# Patient Record
Sex: Male | Born: 1965 | Race: White | Hispanic: No | Marital: Married | Smoking: Never smoker
Health system: Southern US, Community
[De-identification: ages and names within clinical notes are randomized; demographics above are authoritative.]

## PROBLEM LIST (undated history)

## (undated) DIAGNOSIS — L409 Psoriasis, unspecified: Secondary | ICD-10-CM

## (undated) DIAGNOSIS — K7460 Unspecified cirrhosis of liver: Secondary | ICD-10-CM

## (undated) DIAGNOSIS — D696 Thrombocytopenia, unspecified: Secondary | ICD-10-CM

## (undated) DIAGNOSIS — I10 Essential (primary) hypertension: Secondary | ICD-10-CM

## (undated) HISTORY — PX: OTHER SURGICAL HISTORY: SHX169

## (undated) HISTORY — PX: VEIN LIGATION AND STRIPPING: SHX2653

---

## 2000-08-20 ENCOUNTER — Encounter: Admission: RE | Admit: 2000-08-20 | Discharge: 2000-11-18 | Payer: Self-pay | Admitting: Family Medicine

## 2000-09-03 ENCOUNTER — Emergency Department (HOSPITAL_COMMUNITY): Admission: EM | Admit: 2000-09-03 | Discharge: 2000-09-03 | Payer: Self-pay | Admitting: *Deleted

## 2001-12-06 ENCOUNTER — Ambulatory Visit (HOSPITAL_COMMUNITY): Admission: RE | Admit: 2001-12-06 | Discharge: 2001-12-06 | Payer: Self-pay | Admitting: Gastroenterology

## 2001-12-06 ENCOUNTER — Encounter: Payer: Self-pay | Admitting: Gastroenterology

## 2004-08-22 ENCOUNTER — Encounter: Admission: RE | Admit: 2004-08-22 | Discharge: 2004-08-22 | Payer: Self-pay | Admitting: Family Medicine

## 2014-08-05 ENCOUNTER — Telehealth: Payer: Self-pay | Admitting: *Deleted

## 2014-08-05 ENCOUNTER — Ambulatory Visit (INDEPENDENT_AMBULATORY_CARE_PROVIDER_SITE_OTHER): Payer: 59 | Admitting: Physician Assistant

## 2014-08-05 VITALS — BP 130/84 | HR 83 | Temp 99.2°F | Resp 24 | Ht 72.0 in | Wt 207.4 lb

## 2014-08-05 DIAGNOSIS — J029 Acute pharyngitis, unspecified: Secondary | ICD-10-CM

## 2014-08-05 LAB — POCT RAPID STREP A (OFFICE): Rapid Strep A Screen: NEGATIVE

## 2014-08-05 MED ORDER — HYDROCODONE-ACETAMINOPHEN 7.5-325 MG/15ML PO SOLN: 10.0000 mL | Freq: Four times a day (QID) | ORAL | Status: DC | PRN

## 2014-08-05 MED ORDER — AZITHROMYCIN 250 MG PO TABS: ORAL_TABLET | ORAL | Status: DC

## 2014-08-05 MED ORDER — AMOXICILLIN 875 MG PO TABS: 875.0000 mg | ORAL_TABLET | Freq: Two times a day (BID) | ORAL | Status: DC

## 2014-08-05 MED ORDER — IPRATROPIUM BROMIDE 0.03 % NA SOLN: 2.0000 | Freq: Two times a day (BID) | NASAL | Status: DC

## 2014-08-05 NOTE — Patient Instructions (Signed)
Get plenty of rest and drink at least 64 ounces of water daily. 

## 2014-08-05 NOTE — Progress Notes (Signed)
Subjective:    Patient ID: Francisco Jones, male    DOB: 11/19/1965, 49 y.o.   MRN: [id number]   PCP: No primary care provider on file.  Chief Complaint  Patient presents with  . Sore Throat    Started Friday  . Fever  . Cough  . Generalized Body Aches    Allergies  Allergen Reactions  . Penicillins Other (See Comments)    "Buzzing around my head and losing consciousness"    There are no active problems to display for this patient.   Prior to Admission medications   Not on File    Medical, Surgical, Family and Social History reviewed and updated.  HPI  Presents with 3 days of illness, gradually worsening. Began as a cough, which is now nearly completely resolved. Now his primary complaint is sore throat, R>L. HA. Lots of mucous. Subjective fever. Hurts to speak and swallow. Muscles hurt all over. No nausea, vomiting. Has diarrhea yesterday and the day before-no BM today. Urine appears darker than usual.  Review of Systems As above.    Objective:   Physical Exam  Constitutional: He is oriented to person, place, and time. He appears well-developed and well-nourished. He is active and cooperative. No distress.  BP 130/84 mmHg  Pulse 83  Temp(Src) 99.2 F (37.3 C) (Oral)  Resp 24  Ht 6' (1.829 m)  Wt 207 lb 6 oz (94.065 kg)  BMI 28.12 kg/m2  SpO2 98%   HENT:  Head: Normocephalic and atraumatic.  Right Ear: Hearing, tympanic membrane, external ear and ear canal normal.  Left Ear: Hearing, tympanic membrane, external ear and ear canal normal.  Nose: Nose normal.  Mouth/Throat: Uvula is midline and mucous membranes are normal. Oropharyngeal exudate (R>L), posterior oropharyngeal edema (mild) and posterior oropharyngeal erythema (mild) present. No tonsillar abscesses.  Obviously painful to swallow  Eyes: Conjunctivae are normal.  Neck: No thyroid mass and no thyromegaly present.  Cardiovascular: Normal rate, regular rhythm and normal heart sounds.     Pulmonary/Chest: Effort normal and breath sounds normal.  Lymphadenopathy:       Head (right side): No submandibular and no tonsillar adenopathy present.       Head (left side): No submandibular and no tonsillar adenopathy present.    He has no cervical adenopathy.       Right: No supraclavicular adenopathy present.       Left: No supraclavicular adenopathy present.  Neurological: He is alert and oriented to person, place, and time. He has normal strength. No sensory deficit.  Skin: Skin is warm, dry and intact. No rash noted.  Psychiatric: He has a normal mood and affect. His speech is normal and behavior is normal.   Results for orders placed or performed in visit on 08/05/14  POCT rapid strep A  Result Value Ref Range   Rapid Strep A Screen Negative Negative          Assessment & Plan:  1. Acute pharyngitis, unspecified pharyngitis type Exudative pharyngitis. Cover for possible strep with amoxicillin. Anticipatory guidance. Supportive care. RTC if symptoms worsen/persist. Await results of TCx. - POCT rapid strep A - Culture, Group A Strep - ipratropium (ATROVENT) 0.03 % nasal spray; Place 2 sprays into both nostrils 2 (two) times daily.  Dispense: 30 mL; Refill: 0 - HYDROcodone-acetaminophen (HYCET) 7.5-325 mg/15 ml solution; Take 10-15 mLs by mouth every 6 (six) hours as needed.  Dispense: 140 mL; Refill: 0 - amoxicillin (AMOXIL) 875 MG tablet; Take 1 tablet (875 mg  total) by mouth 2 (two) times daily.  Dispense: 20 tablet; Refill: 0   Fara Chute, PA-C Physician Assistant-Certified [facility]

## 2014-08-05 NOTE — Telephone Encounter (Signed)
Pharm called. Pt was rx'ed Amox and is allergic to PCN. Dr. Everlene Farrier spoke with pharm and changed to [place].  Chelle, FYI

## 2014-08-06 NOTE — Telephone Encounter (Signed)
Noted! Thank you

## 2014-08-08 LAB — CULTURE, GROUP A STREP: Organism ID, Bacteria: NORMAL

## 2016-04-17 ENCOUNTER — Other Ambulatory Visit: Payer: Self-pay | Admitting: Family Medicine

## 2016-04-17 DIAGNOSIS — R7989 Other specified abnormal findings of blood chemistry: Secondary | ICD-10-CM

## 2016-04-17 DIAGNOSIS — R945 Abnormal results of liver function studies: Principal | ICD-10-CM

## 2016-04-30 ENCOUNTER — Ambulatory Visit
Admission: RE | Admit: 2016-04-30 | Discharge: 2016-04-30 | Disposition: A | Discharge status: Home / Self Care | Payer: 59 | Source: Ambulatory Visit | Attending: Family Medicine | Admitting: Family Medicine

## 2016-04-30 DIAGNOSIS — R945 Abnormal results of liver function studies: Principal | ICD-10-CM

## 2016-04-30 DIAGNOSIS — R7989 Other specified abnormal findings of blood chemistry: Secondary | ICD-10-CM

## 2016-06-01 ENCOUNTER — Other Ambulatory Visit: Payer: Self-pay | Admitting: Family Medicine

## 2016-06-01 DIAGNOSIS — R519 Headache, unspecified: Secondary | ICD-10-CM

## 2016-06-01 DIAGNOSIS — R51 Headache: Principal | ICD-10-CM

## 2016-06-07 ENCOUNTER — Ambulatory Visit
Admission: RE | Admit: 2016-06-07 | Discharge: 2016-06-07 | Disposition: A | Discharge status: Home / Self Care | Payer: 59 | Source: Ambulatory Visit | Attending: Family Medicine | Admitting: Family Medicine

## 2016-06-07 DIAGNOSIS — R519 Headache, unspecified: Secondary | ICD-10-CM

## 2016-06-07 DIAGNOSIS — R51 Headache: Principal | ICD-10-CM

## 2016-07-14 DIAGNOSIS — L739 Follicular disorder, unspecified: Secondary | ICD-10-CM | POA: Diagnosis not present

## 2016-07-14 DIAGNOSIS — L720 Epidermal cyst: Secondary | ICD-10-CM | POA: Diagnosis not present

## 2016-07-14 DIAGNOSIS — L400 Psoriasis vulgaris: Secondary | ICD-10-CM | POA: Diagnosis not present

## 2016-08-10 DIAGNOSIS — Z1211 Encounter for screening for malignant neoplasm of colon: Secondary | ICD-10-CM | POA: Diagnosis not present

## 2016-11-03 DIAGNOSIS — D1801 Hemangioma of skin and subcutaneous tissue: Secondary | ICD-10-CM | POA: Diagnosis not present

## 2016-11-03 DIAGNOSIS — L400 Psoriasis vulgaris: Secondary | ICD-10-CM | POA: Diagnosis not present

## 2016-11-03 DIAGNOSIS — L720 Epidermal cyst: Secondary | ICD-10-CM | POA: Diagnosis not present

## 2017-02-03 DIAGNOSIS — E7800 Pure hypercholesterolemia, unspecified: Secondary | ICD-10-CM | POA: Diagnosis not present

## 2017-02-03 DIAGNOSIS — Z0000 Encounter for general adult medical examination without abnormal findings: Secondary | ICD-10-CM | POA: Diagnosis not present

## 2017-07-30 DIAGNOSIS — Z79899 Other long term (current) drug therapy: Secondary | ICD-10-CM | POA: Diagnosis not present

## 2017-07-30 DIAGNOSIS — L400 Psoriasis vulgaris: Secondary | ICD-10-CM | POA: Diagnosis not present

## 2017-11-12 DIAGNOSIS — L820 Inflamed seborrheic keratosis: Secondary | ICD-10-CM | POA: Diagnosis not present

## 2018-02-24 DIAGNOSIS — Z719 Counseling, unspecified: Secondary | ICD-10-CM | POA: Diagnosis not present

## 2018-03-17 DIAGNOSIS — Z0000 Encounter for general adult medical examination without abnormal findings: Secondary | ICD-10-CM | POA: Diagnosis not present

## 2018-03-17 DIAGNOSIS — E7800 Pure hypercholesterolemia, unspecified: Secondary | ICD-10-CM | POA: Diagnosis not present

## 2018-03-17 DIAGNOSIS — Z23 Encounter for immunization: Secondary | ICD-10-CM | POA: Diagnosis not present

## 2018-05-02 DIAGNOSIS — M7711 Lateral epicondylitis, right elbow: Secondary | ICD-10-CM | POA: Diagnosis not present

## 2018-05-24 DIAGNOSIS — L400 Psoriasis vulgaris: Secondary | ICD-10-CM | POA: Diagnosis not present

## 2018-06-02 DIAGNOSIS — R42 Dizziness and giddiness: Secondary | ICD-10-CM | POA: Diagnosis not present

## 2018-06-28 DIAGNOSIS — L400 Psoriasis vulgaris: Secondary | ICD-10-CM | POA: Diagnosis not present

## 2018-09-22 DIAGNOSIS — R52 Pain, unspecified: Secondary | ICD-10-CM | POA: Diagnosis not present

## 2018-09-22 DIAGNOSIS — B349 Viral infection, unspecified: Secondary | ICD-10-CM | POA: Diagnosis not present

## 2019-07-28 ENCOUNTER — Other Ambulatory Visit: Payer: 59

## 2021-12-15 ENCOUNTER — Other Ambulatory Visit: Payer: Self-pay | Admitting: Family Medicine

## 2021-12-15 DIAGNOSIS — R748 Abnormal levels of other serum enzymes: Secondary | ICD-10-CM

## 2021-12-15 DIAGNOSIS — D696 Thrombocytopenia, unspecified: Secondary | ICD-10-CM

## 2021-12-17 ENCOUNTER — Ambulatory Visit
Admission: RE | Admit: 2021-12-17 | Discharge: 2021-12-17 | Disposition: A | Discharge status: Home / Self Care | Payer: 59 | Source: Ambulatory Visit | Attending: Family Medicine | Admitting: Family Medicine

## 2021-12-17 DIAGNOSIS — R748 Abnormal levels of other serum enzymes: Secondary | ICD-10-CM

## 2021-12-17 DIAGNOSIS — D696 Thrombocytopenia, unspecified: Secondary | ICD-10-CM

## 2022-03-30 ENCOUNTER — Other Ambulatory Visit: Payer: Self-pay | Admitting: Gastroenterology

## 2022-03-30 DIAGNOSIS — K7460 Unspecified cirrhosis of liver: Secondary | ICD-10-CM

## 2022-04-15 ENCOUNTER — Ambulatory Visit
Admission: RE | Admit: 2022-04-15 | Discharge: 2022-04-15 | Disposition: A | Discharge status: Home / Self Care | Payer: 59 | Source: Ambulatory Visit | Attending: Gastroenterology | Admitting: Gastroenterology

## 2022-04-15 DIAGNOSIS — K7460 Unspecified cirrhosis of liver: Secondary | ICD-10-CM

## 2022-04-15 MED ORDER — GADOPICLENOL 0.5 MMOL/ML IV SOLN
8.0000 mL | Freq: Once | INTRAVENOUS | Status: AC | PRN
  Administered 2022-04-15: 8 mL via INTRAVENOUS

## 2022-07-15 ENCOUNTER — Other Ambulatory Visit: Payer: Self-pay | Admitting: Gastroenterology

## 2022-07-15 DIAGNOSIS — K769 Liver disease, unspecified: Secondary | ICD-10-CM

## 2022-07-15 DIAGNOSIS — K7469 Other cirrhosis of liver: Secondary | ICD-10-CM

## 2022-09-01 NOTE — Progress Notes (Unsigned)
NEW PATIENT Date of Service/Encounter:  09/02/22 Referring provider: Gaynelle Arabian, MD Primary care provider: Gaynelle Arabian, MD  Subjective:  Francisco Jones is a 57 y.o. male with a PMHx of cirrhosis, psoriasis presenting today for evaluation of concern for food allergies. History obtained from: chart review and patient.   Patient reports a history of psoriasis, managed by dermatology. He is concerned about foods being a trigger for his psoriasis.  He was diagnosed 10 years ago. He does notice any specific food triggers.  He does have some concerns possibly for dairy or meat.  His rash never disappears but does sometimes worsen. He is unsure if it is stress. No one is managing his psoriasis at the moment.   He also has liver cirrhosis which he is currently more focused on than his psoriasis, but notes it affects his quality of life.    He is followed by [facility] dermatology for psoriasis. He has been on Tremfya in the past. Per PCP referral notes 08/04/2022-patient requesting food allergy testing to see if foods are exacerbating his psoriasis.  Penicillin allergy:  He reports having a reaction as a young boy requiring an injection for treatment. Has avoided since.   Other allergy screening: Asthma: no Rhino conjunctivitis: no Food allergy: no Medication allergy: no Hymenoptera allergy: no Urticaria: no Eczema:no  Past Medical History: History reviewed. No pertinent past medical history. Medication List:  Current Outpatient Medications  Medication Sig Dispense Refill   azithromycin (ZITHROMAX) 250 MG tablet Take 2 pills today then one a day for 4 additional days 6 tablet 0   carvedilol (COREG) 6.25 MG tablet Take 6.25 mg by mouth 2 (two) times daily.     HYDROcodone-acetaminophen (HYCET) 7.5-325 mg/15 ml solution Take 10-15 mLs by mouth every 6 (six) hours as needed. 140 mL 0   ipratropium (ATROVENT) 0.03 % nasal spray Place 2 sprays into both nostrils 2 (two) times daily.  (Patient not taking: Reported on 09/02/2022) 30 mL 0   No current facility-administered medications for this visit.   Known Allergies:  Allergies  Allergen Reactions   Penicillins Other (See Comments)    "Buzzing around my head and losing consciousness"   Past Surgical History: History reviewed. No pertinent surgical history. 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   ROS:  All other systems negative except as noted per HPI.  Objective:  Blood pressure 112/60, pulse 62, temperature 98.2 F (36.8 C), temperature source Temporal, resp. rate 20, height 5' 11"$  (1.803 m), weight 197 lb 8 oz (89.6 kg), SpO2 96 %. Body mass index is 27.55 kg/m. Physical Exam:  General Appearance:  Alert, cooperative, no distress, appears stated age  Head:  Normocephalic, without obvious abnormality, atraumatic  Eyes:  Conjunctiva clear, EOM's intact  Nose: Nares normal, normal mucosa and no visible anterior polyps  Throat: Lips, tongue normal; teeth and gums normal, normal posterior oropharynx  Neck: Supple, symmetrical  Lungs:   clear to auscultation bilaterally, Respirations unlabored, no coughing  Heart:  regular rate and rhythm and no murmur, Appears well perfused  Extremities: No edema  Skin: Extensive psoriatic plaques covering most of lower half of back, lower 3/4 of arms, scattered patches on  anterior chest.  Neurologic: No gross deficits   Diagnostics: Skin Testing:  Deferred due to insurance carrier .  Assessment and Plan  Psoriasis:  - we will schedule food allergy testing for Wednesday March 6th at 1:30 PM-please do not take any antihistamines prior to this meeting Your symptoms are less  concerning for food allergy, but could be secondary to food intolerance - food ALLERGY (causes specific set of symptoms from histamine release-can be any combination of hives, nausea, vomiting, diarrhea, trouble breathing, swelling, that occurs quickly after eating a food, resolves within a day and occurs after every exposure to that food) - food INTOLERANCE-certain foods can cause a variety of symptoms on a consistent basis, not necessarily histamine related symptoms as above (can be headaches, bloating, brain fog, etc), typically allergy testing is negative; avoidance is still recommended but no need to carry an epinephrine autoinjector  - food journaling can be helpful to see if a specific food is causing symptoms; if unsure, remove from diet and if symptoms resolve, try adding back in.  If symptoms return, this food is likely causing symptoms and should be eaten in reduced quantities or avoided altogether. - continue management as per Dermatology  Consider reading about anti-inflammatory diets: [email or web address] [email or web address]  H/O Penicillin allergy: - please schedule follow-up appt at your convenience for penicillin testing followed by graded oral challenge if indicated - please refrain from taking any antihistamines at least 3 days prior to this appointment  - around 80% of individuals outgrow this allergy in ~ 10 years and carrying it as a diagnosis can prevent you from getting proper therapy if needed   Follow up : Wednesday March 6th at 1:30 PM-please do not take any antihistamines prior to this meeting  This note in its entirety was forwarded to the Provider who requested this consultation.  Thank you for your kind referral. I appreciate the opportunity to take part in Francisco Jones's care. Please do not hesitate to contact me with questions.  Sincerely,  Sigurd Sos,  MD [facility] [place] [facility] [place]

## 2022-09-02 ENCOUNTER — Other Ambulatory Visit: Payer: Self-pay

## 2022-09-02 ENCOUNTER — Ambulatory Visit (INDEPENDENT_AMBULATORY_CARE_PROVIDER_SITE_OTHER): Payer: 59 | Admitting: Internal Medicine

## 2022-09-02 ENCOUNTER — Encounter: Payer: Self-pay | Admitting: Internal Medicine

## 2022-09-02 VITALS — BP 112/60 | HR 62 | Temp 98.2°F | Resp 20 | Ht 71.0 in | Wt 197.5 lb

## 2022-09-02 DIAGNOSIS — T781XXA Other adverse food reactions, not elsewhere classified, initial encounter: Secondary | ICD-10-CM

## 2022-09-02 DIAGNOSIS — L409 Psoriasis, unspecified: Secondary | ICD-10-CM | POA: Diagnosis not present

## 2022-09-02 DIAGNOSIS — Z880 Allergy status to penicillin: Secondary | ICD-10-CM | POA: Diagnosis not present

## 2022-09-02 NOTE — Patient Instructions (Addendum)
Psoriasis:  - we will schedule food allergy testing for Wednesday March 6th at 1:30 PM-please do not take any antihistamines prior to this meeting Your symptoms are less concerning for food allergy, but could be secondary to food intolerance - food ALLERGY (causes specific set of symptoms from histamine release-can be any combination of hives, nausea, vomiting, diarrhea, trouble breathing, swelling, that occurs quickly after eating a food, resolves within a day and occurs after every exposure to that food) - food INTOLERANCE-certain foods can cause a variety of symptoms on a consistent basis, not necessarily histamine related symptoms as above (can be headaches, bloating, brain fog, etc), typically allergy testing is negative; avoidance is still recommended but no need to carry an epinephrine autoinjector  - food journaling can be helpful to see if a specific food is causing symptoms; if unsure, remove from diet and if symptoms resolve, try adding back in.  If symptoms return, this food is likely causing symptoms and should be eaten in reduced quantities or avoided altogether. - continue management as per Dermatology  Consider reading about anti-inflammatory diets: [email or web address] [email or web address].[email or web address]  H/O Penicillin allergy: - please schedule follow-up appt at your convenience for penicillin testing followed by graded oral challenge if indicated - please refrain from taking any antihistamines at least 3 days prior to this appointment  - around 80% of individuals outgrow this allergy in ~ 10 years and carrying it as a diagnosis can prevent you from getting proper therapy if needed   Follow up : Wednesday March 6th at 1:30 PM-please do not take any antihistamines prior to this meeting It was a pleasure meeting you in clinic today! Thank you for allowing me to participate in your  care.  Sigurd Sos, MD [facility] [place]

## 2022-09-14 NOTE — Progress Notes (Unsigned)
Date of Service/Encounter:  09/16/22  Allergy testing appointment   Initial visit on 09/02/22, seen for psoriasis and rash.  Please see that note for additional details.  Today reports for allergy diagnostic testing:    DIAGNOSTICS:  Skin Testing: Food allergy panel. Adequate positive and negative controls Results discussed with patient/family.  Airborne Adult Perc - 09/16/22 1331     Time Antigen Placed 1331             Food Adult Perc - 09/16/22 1300     Time Antigen Placed 1328    Allergen Manufacturer Lavella Hammock    Number of allergen test 72     Control-buffer 50% Glycerol Negative    Control-Histamine 1 mg/ml 3+    1. Peanut Negative    2. Soybean Negative    3. Wheat Negative    4. Sesame Negative    5. Milk, cow Negative    6. Egg White, Chicken Negative    7. Casein Negative    8. Shellfish Mix Negative    9. Fish Mix Negative    10. Cashew Negative    11. Pecan Food Negative    12. [place] Negative    13. Almond Negative    14. Hazelnut Negative    15. [place] nut Negative    16. Coconut Negative    17. Pistachio Negative    18. Catfish Negative    19. Bass Negative    20. Trout Negative    21. Tuna Negative    22. Salmon Negative    23. Flounder Negative    24. Codfish Negative    25. Shrimp Negative    26. Crab Negative    27. Lobster Negative    28. Oyster Negative    29. Scallops Negative    30. Barley Negative    31. Oat  Negative    32. Rye  Negative    33. Hops Negative    34. Rice Negative    35. Cottonseed Negative    36. Saccharomyces Cerevisiae  Negative    37. Pork Negative    38. [place] Meat Negative    39. Chicken Meat Negative    40. Beef Negative    41. Lamb Negative    42. Tomato Negative    43. White Potato Negative    44. Sweet Potato Negative    45. Pea, Green/English Negative    46. Navy Bean Negative    47. Mushrooms Negative    48. Avocado Negative    49. Onion Negative    50. Cabbage Negative    51.  Carrots Negative    52. Celery Negative    53. Corn Negative    54. Cucumber Negative    55. Grape (White seedless) Negative    56. Orange  Negative    57. Banana Negative    58. Apple Negative    59. Peach Negative    60. Strawberry Negative    61. Cantaloupe Negative    62. Watermelon Negative    63. Pineapple Negative    64. Chocolate/Cacao bean Negative    65. Karaya Gum Negative    66. Acacia (Arabic Gum) Negative    67. Cinnamon Negative    68. Nutmeg Negative    69. Ginger Negative    70. Garlic Negative    71. Pepper, black Negative    72. Mustard Negative             Allergy testing results were read  and interpreted by myself, documented by clinical staff.  Patient provided with copy of allergy testing along with avoidance measures when indicated.   Sigurd Sos, MD  [facility] [place] [facility] [place]

## 2022-09-16 ENCOUNTER — Encounter: Payer: Self-pay | Admitting: Internal Medicine

## 2022-09-16 ENCOUNTER — Other Ambulatory Visit: Payer: Self-pay

## 2022-09-16 ENCOUNTER — Ambulatory Visit (INDEPENDENT_AMBULATORY_CARE_PROVIDER_SITE_OTHER): Payer: 59 | Admitting: Internal Medicine

## 2022-09-16 VITALS — BP 110/60 | HR 66 | Temp 97.3°F | Resp 16 | Ht 71.0 in | Wt 197.7 lb

## 2022-09-16 DIAGNOSIS — T781XXA Other adverse food reactions, not elsewhere classified, initial encounter: Secondary | ICD-10-CM

## 2022-09-16 DIAGNOSIS — R21 Rash and other nonspecific skin eruption: Secondary | ICD-10-CM | POA: Diagnosis not present

## 2022-09-16 MED ORDER — AMOXICILLIN 400 MG/5ML PO SUSR: ORAL | 0 refills | Status: DC

## 2022-09-16 NOTE — Patient Instructions (Signed)
Psoriasis:  - Food allergy testing to entire food panel negative Your symptoms are less concerning for food allergy, but could be secondary to food intolerance - food ALLERGY (causes specific set of symptoms from histamine release-can be any combination of hives, nausea, vomiting, diarrhea, trouble breathing, swelling, that occurs quickly after eating a food, resolves within a day and occurs after every exposure to that food) - food INTOLERANCE-certain foods can cause a variety of symptoms on a consistent basis, not necessarily histamine related symptoms as above (can be headaches, bloating, brain fog, etc), typically allergy testing is negative; avoidance is still recommended but no need to carry an epinephrine autoinjector  - food journaling can be helpful to see if a specific food is causing symptoms; if unsure, remove from diet and if symptoms resolve, try adding back in.  If symptoms return, this food is likely causing symptoms and should be eaten in reduced quantities or avoided altogether. - continue management as per Dermatology  Consider reading about anti-inflammatory diets: [email or web address] [email or web address].[email or web address]  H/O Penicillin allergy: - please schedule follow-up appt at your convenience for penicillin testing followed by graded oral challenge if indicated - please refrain from taking any antihistamines at least 3 days prior to this appointment  - around 80% of individuals outgrow this allergy in ~ 10 years and carrying it as a diagnosis can prevent you from getting proper therapy if needed - amoxicillin Rx given; fill before your appointment and bring to appointment. DO NOT TAKE.   Follow up : for penicillin testing It was a pleasure seeing you again in clinic today! Thank you for allowing me to participate in your care.  Sigurd Sos, MD [facility]  [place]

## 2023-01-26 ENCOUNTER — Other Ambulatory Visit: Payer: Self-pay | Admitting: Gastroenterology

## 2023-01-28 ENCOUNTER — Encounter (HOSPITAL_COMMUNITY): Payer: Self-pay | Admitting: Gastroenterology

## 2023-02-08 NOTE — H&P (Signed)
History of Present Illness General:         57 year old male, newly diagnosed cirrhosis, likely related to [facility]        Work-up showed iron saturation 70% with a normal ferritin and negative HFE gene mutation, unremarkable labs for ceruloplasmin/hepatitis C/hepatitis B/ASMA/AMA/alpha-1 antitrypsin.        Normal alpha-fetoprotein from 9/23 with normal LFTs except T. bili of 4.6, PT INR of 13.5/1.3        MRI 04/15/2022: Cirrhosis portal hypertension, splenomegaly, 12 mm liver lesion, recommend follow-up with pre and postcontrast MRI/MRCP in 3 to 6 months        EGD 04/22/2022 showed active chronic H. 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 Matthias Hughs: Screening, normal, repeat in 10 years        He is doing well, he has regular Bms, 1/day, denies blood in stool or black stools, he may have gas pain.        Prior to treatment for H pylori, he had generalized and mid abdominal pain.        Denies acid reflux or heartburn, rarely dry food goes slowly and he may need water, but denies difficulty or pain on swallowing.        He has a good appetite.        He is losing weight but his diet is different, he is avoiding sugar, eating smaller amounts of sugar and salt, eating a lot of vegetables, fish, meat.        Since 12/2021, about 40 lbs weight loss.        He has an appointment with [facility] hepatology on 07/23/2022.  Past Medical History      Hypercholesterolemia.      IBS.      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      Psoriasis.      H. Pylori positive. Surgical History       R Leg - Vein Stripping 1996       L Knee Arthroscopy 2007       Liver Biopsy (see 11/03 lab sheet) 12/2001       Colonoscopy 2018       EGD 04/2022 Family History Father: alive, Lives in [place] Mother: deceased No family hx of colon  cancer/polyps or liver disease. Social History General:  Tobacco use      cigarettes:  Former smoker     Tobacco history last updated  05/26/2022     Vaping  No EXPOSURE TO PASSIVE SMOKE: no. Alcohol: no. Caffeine: no. Recreational drug use: no. Marital Status: married. 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 Allergies Penicillin: Allergy Hospitalization/Major Diagnostic Procedure None in the past year 05/2022 Review of Systems GI PROCEDURE:         Pacemaker/ AICD no.  Artificial heart valves no.  MI/heart attack no.  Abnormal heart rhythm no.  Angina no.  CVA no.  Hypertension no.  Hypotension no.  Asthma, COPD no.  Sleep apnea no.  Seizure disorders no.  Artificial joints no.  Severe DJD no.  Diabetes no.  Significant headaches no.  Vertigo no.  Depression/anxiety no.  Abnormal bleeding no.  Kidney Disease no.  Liver disease no.  Chance of pregnancy no.  Blood transfusion no  Vital Signs Wt: 196.6, Wt change: -35.8 lbs, Ht: 71.75,  BMI: 26.85, Temp: 98.1, Pulse sitting: 61, BP sitting: 120/69.  Examination Gastroenterology::        GENERAL APPEARANCE: Well developed, well nourished, no active distress, pleasant.         SCLERA: anicteric.         CARDIOVASCULAR Normal RRR .         RESPIRATORY Breath sounds normal. Respiration even and unlabored.         ABDOMEN No masses palpated. Liver and spleen not palpated, normal. Bowel sounds normal, Abdomen not distended.         EXTREMITIES: No edema.         NEURO: alert, oriented to time, place and person, normal gait, NO ASTERIXIS.         PSYCH: mood/affect normal.  Assessments 1. Helicobacter pylori infection - A04.8 (Primary) 2. Other cirrhosis of liver - K74.69 3. Liver lesion - K76.9 4. Esophageal varices without bleeding, unspecified esophageal varices type - I85.00  Treatment 1. Helicobacter pylori infection       LAB: H. pylori Stool Ag, EIA ([id number]) (Ordered for 05/26/2022) Notes: S/P treatment for H pylori, remains  aysmptomatic, will check stool for H pylori Ag to document eradication.    2. Other cirrhosis of liver       LAB: CBC without Diff (Ordered for 09/28/2022)      LAB: Comp Metabolic Panel (Ordered for 09/28/2022)      LAB: AFP, Serum, Tumor Marker ([id number]) (Ordered for 09/28/2022)      LAB: PT (Prothrombin Time) ([id number]) (Ordered for 09/28/2022) Notes: Appears compensated. NO variceal bleeding, ascites or encephalopathy. Rrepeat EGD recommended in few months at [facility] for banding. Will get labs for CBC, CMP, PT/INR and AFP in 09/2022.    3. Liver lesion       IMAGING: MRI : Abdomen With and without contrast, MRCP in 07/2022  Notes: Will need MRI with and without contrast and MRCP in 3 months, 07/2022.    4. Esophageal varices without bleeding, unspecified esophageal varices type       IMAGING: EGD w/ DILATATION Notes: Will schedule for EGD with banding at [facility] in a few months.

## 2023-02-09 ENCOUNTER — Encounter (HOSPITAL_COMMUNITY): Payer: Self-pay | Admitting: Gastroenterology

## 2023-02-09 ENCOUNTER — Ambulatory Visit (HOSPITAL_BASED_OUTPATIENT_CLINIC_OR_DEPARTMENT_OTHER): Payer: 59 | Admitting: Anesthesiology

## 2023-02-09 ENCOUNTER — Other Ambulatory Visit: Payer: Self-pay

## 2023-02-09 ENCOUNTER — Ambulatory Visit (HOSPITAL_COMMUNITY): Payer: 59 | Admitting: Anesthesiology

## 2023-02-09 ENCOUNTER — Encounter (HOSPITAL_COMMUNITY)
Admission: RE | Disposition: A | Discharge status: Home / Self Care | Payer: Self-pay | Source: Home / Self Care | Attending: Gastroenterology

## 2023-02-09 ENCOUNTER — Ambulatory Visit (HOSPITAL_COMMUNITY)
Admission: RE | Admit: 2023-02-09 | Discharge: 2023-02-09 | Disposition: A | Discharge status: Home / Self Care | Payer: 59 | Source: Home / Self Care | Attending: Gastroenterology | Admitting: Gastroenterology

## 2023-02-09 DIAGNOSIS — K7469 Other cirrhosis of liver: Secondary | ICD-10-CM | POA: Diagnosis not present

## 2023-02-09 DIAGNOSIS — K766 Portal hypertension: Secondary | ICD-10-CM | POA: Insufficient documentation

## 2023-02-09 DIAGNOSIS — K2970 Gastritis, unspecified, without bleeding: Secondary | ICD-10-CM

## 2023-02-09 DIAGNOSIS — I8510 Secondary esophageal varices without bleeding: Secondary | ICD-10-CM | POA: Insufficient documentation

## 2023-02-09 DIAGNOSIS — K3189 Other diseases of stomach and duodenum: Secondary | ICD-10-CM | POA: Diagnosis not present

## 2023-02-09 HISTORY — PX: ESOPHAGEAL BANDING: SHX5518

## 2023-02-09 HISTORY — PX: BIOPSY: SHX5522

## 2023-02-09 HISTORY — PX: ESOPHAGOGASTRODUODENOSCOPY (EGD) WITH PROPOFOL: SHX5813

## 2023-02-09 SURGERY — ESOPHAGOGASTRODUODENOSCOPY (EGD) WITH PROPOFOL
Anesthesia: Monitor Anesthesia Care

## 2023-02-09 MED ORDER — DEXMEDETOMIDINE HCL IN NACL 80 MCG/20ML IV SOLN
INTRAVENOUS | Status: DC | PRN
  Administered 2023-02-09: 8 ug via INTRAVENOUS

## 2023-02-09 MED ORDER — LACTATED RINGERS IV SOLN: INTRAVENOUS | Status: DC

## 2023-02-09 MED ORDER — LACTATED RINGERS IV SOLN: INTRAVENOUS | Status: DC | PRN

## 2023-02-09 MED ORDER — PROPOFOL 500 MG/50ML IV EMUL
INTRAVENOUS | Status: DC | PRN
  Administered 2023-02-09: 100 ug/kg/min via INTRAVENOUS
  Administered 2023-02-09 (×3): 50 mg via INTRAVENOUS

## 2023-02-09 MED ORDER — SODIUM CHLORIDE 0.9 % IV SOLN: INTRAVENOUS | Status: DC

## 2023-02-09 SURGICAL SUPPLY — 15 items

## 2023-02-09 NOTE — Anesthesia Preprocedure Evaluation (Signed)
Anesthesia Evaluation  Patient identified by MRN, date of birth, ID band Patient awake    Reviewed: Allergy & Precautions, H&P , NPO status , Patient's Chart, lab work & pertinent test results  Airway Mallampati: II   Neck ROM: full    Dental   Pulmonary neg pulmonary ROS   breath sounds clear to auscultation       Cardiovascular negative cardio ROS  Rhythm:regular Rate:Normal     Neuro/Psych    GI/Hepatic ,,,(+) Cirrhosis   Esophageal Varices      Endo/Other    Renal/GU      Musculoskeletal   Abdominal   Peds  Hematology   Anesthesia Other Findings   Reproductive/Obstetrics                             Anesthesia Physical Anesthesia Plan  ASA: 2  Anesthesia Plan: MAC   Post-op Pain Management:    Induction: Intravenous  PONV Risk Score and Plan: 1 and Propofol infusion and Treatment may vary due to age or medical condition  Airway Management Planned: Nasal Cannula  Additional Equipment:   Intra-op Plan:   Post-operative Plan:   Informed Consent: I have reviewed the patients History and Physical, chart, labs and discussed the procedure including the risks, benefits and alternatives for the proposed anesthesia with the patient or authorized representative who has indicated his/her understanding and acceptance.     Dental advisory given  Plan Discussed with: CRNA, Anesthesiologist and Surgeon  Anesthesia Plan Comments:        Anesthesia Quick Evaluation

## 2023-02-09 NOTE — Discharge Instructions (Signed)
YOU HAD AN ENDOSCOPIC PROCEDURE TODAY: Refer to the procedure report and other information in the discharge instructions given to you for any specific questions about what was found during the examination. If this information does not answer your questions, please call [place] office at [phone] to clarify.   YOU SHOULD EXPECT: Some feelings of bloating in the abdomen. Passage of more gas than usual. Walking can help get rid of the air that was put into your GI tract during the procedure and reduce the bloating. If you had a lower endoscopy (such as a colonoscopy or flexible sigmoidoscopy) you may notice spotting of blood in your stool or on the toilet paper. Some abdominal soreness may be present for a day or two, also.  DIET: Your first meal following the procedure should be a light meal and then it is ok to progress to your normal diet. A half-sandwich or bowl of soup is an example of a good first meal. Heavy or fried foods are harder to digest and may make you feel nauseous or bloated. Drink plenty of fluids but you should avoid alcoholic beverages for 24 hours. If you had a esophageal dilation, please see attached instructions for diet.    ACTIVITY: Your care partner should take you home directly after the procedure. You should plan to take it easy, moving slowly for the rest of the day. You can resume normal activity the day after the procedure however YOU SHOULD NOT DRIVE, use power tools, machinery or perform tasks that involve climbing or major physical exertion for 24 hours (because of the sedation medicines used during the test).   SYMPTOMS TO REPORT IMMEDIATELY: A gastroenterologist can be reached at any hour. Please call [phone]  for any of the following symptoms:   Following upper endoscopy (EGD, EUS, ERCP, esophageal dilation) Vomiting of blood or coffee ground material  New, significant abdominal pain  New, significant chest pain or pain under the shoulder blades  Painful or  persistently difficult swallowing  New shortness of breath  Black, tarry-looking or red, bloody stools  FOLLOW UP:  If any biopsies were taken you will be contacted by phone or by letter within the next 1-3 weeks. Call [phone]  if you have not heard about the biopsies in 3 weeks.  Please also call with any specific questions about appointments or follow up tests.

## 2023-02-09 NOTE — Transfer of Care (Signed)
Immediate Anesthesia Transfer of Care Note  Patient: Francisco Jones  Procedure(s) Performed: ESOPHAGOGASTRODUODENOSCOPY (EGD) WITH PROPOFOL BIOPSY ESOPHAGEAL BANDING  Patient Location: PACU  Anesthesia Type:MAC  Level of Consciousness: awake and alert   Airway & Oxygen Therapy: Patient Spontanous Breathing and Patient connected to face mask oxygen  Post-op Assessment: Report given to RN and Post -op Vital signs reviewed and stable  Post vital signs: Reviewed and stable  Last Vitals:  Vitals Value Taken Time  BP 110/70 02/09/23 0950  Temp 36.6 C 02/09/23 0948  Pulse 58 02/09/23 0953  Resp 17 02/09/23 0953  SpO2 98 % 02/09/23 0953  Vitals shown include unfiled device data.  Last Pain:  Vitals:   02/09/23 0948  TempSrc: Temporal  PainSc: 0-No pain         Complications: No notable events documented.

## 2023-02-09 NOTE — Interval H&P Note (Signed)
History and Physical Interval Note: 56/male with cirrhosis and esophageal varices for EGD with banding with propofol.  02/09/2023 8:42 AM  Francisco Jones  has presented today for EGD with banding with propofol , with the diagnosis of cirrhosis and esophageal varices.  The various methods of treatment have been discussed with the patient and family. After consideration of risks, benefits and other options for treatment, the patient has consented to  Procedure(s): ESOPHAGOGASTRODUODENOSCOPY (EGD) WITH PROPOFOL (N/A) as a surgical intervention.  The patient's history has been reviewed, patient examined, no change in status, stable for surgery.  I have reviewed the patient's chart and labs.  Questions were answered to the patient's satisfaction.     Kerin Salen

## 2023-02-09 NOTE — Op Note (Signed)
[facility] Patient Name: Francisco Jones Procedure Date: 02/09/2023 MRN: [id number] Attending MD: Kerin Salen , MD, [id number] Date of Birth: [birth year] CSN: [id number] Age: 57 Admit Type: Outpatient Procedure:                Upper GI endoscopy Indications:              Previously treated for Helicobacter pylori, For                            therapy of esophageal varices Providers:                Kerin Salen, MD, Norman Clay, RN, Beryle Beams,                            Technician Referring MD:             Starr Sinclair Medicines:                See the Anesthesia note for documentation of the                            administered medications Complications:            No immediate complications. Estimated blood loss:                            Minimal. Estimated Blood Loss:     Estimated blood loss was minimal. Procedure:                Pre-Anesthesia Assessment:                           - Prior to the procedure, a History and Physical                            was performed, and patient medications and                            allergies were reviewed. The patient's tolerance of                            previous anesthesia was also reviewed. The risks                            and benefits of the procedure and the sedation                            options and risks were discussed with the patient.                            All questions were answered, and informed consent                            was obtained. Prior Anticoagulants: The patient has                            taken  no anticoagulant or antiplatelet agents. ASA                            Grade Assessment: II - A patient with mild systemic                            disease. After reviewing the risks and benefits,                            the patient was deemed in satisfactory condition to                            undergo the procedure.                           After obtaining informed  consent, the endoscope was                            passed under direct vision. Throughout the                            procedure, the patient's blood pressure, pulse, and                            oxygen saturations were monitored continuously. The                            GIF-H190 ([id number]) Olympus endoscope was introduced                            through the mouth, and advanced to the second part                            of duodenum. The upper GI endoscopy was                            accomplished without difficulty. The patient                            tolerated the procedure well. Scope In: Scope Out: Findings:      Grade II varices were found in the lower third of the esophagus. They       were medium in size. Three bands were successfully placed with complete       eradication, resulting in deflation of varices. There was no bleeding       during and at the end of the procedure.      Patchy moderately erythematous mucosa without bleeding was found in the       stomach. Biopsies were taken with a cold forceps for Helicobacter pylori       testing.      The cardia and gastric fundus were normal on retroflexion.      Mild portal hypertensive gastropathy was found in the entire examined       stomach.      The examined duodenum was normal. Impression:               -  Grade II esophageal varices. Completely                            eradicated. Banded.                           - Erythematous mucosa in the stomach. Biopsied.                           - Portal hypertensive gastropathy.                           - Normal examined duodenum. Moderate Sedation:      Patient did not receive moderate sedation for this procedure, but       instead received monitored anesthesia care. Recommendation:           - Patient has a contact number available for                            emergencies. The signs and symptoms of potential                            delayed  complications were discussed with the                            patient. Return to normal activities tomorrow.                            Written discharge instructions were provided to the                            patient.                           - Clear liquid diet for 6 hours then resume soft                            diet.                           - Repeat upper endoscopy in 3 months for                            retreatment. Procedure Code(s):        --- Professional ---                           [phone], Esophagogastroduodenoscopy, flexible,                            transoral; with band ligation of esophageal/gastric                            varices                           43239, Esophagogastroduodenoscopy, flexible,  transoral; with biopsy, single or multiple Diagnosis Code(s):        --- Professional ---                           I85.00, Esophageal varices without bleeding                           K31.89, Other diseases of stomach and duodenum                           K76.6, Portal hypertension CPT copyright 2022 American Medical Association. All rights reserved. The codes documented in this report are preliminary and upon coder review may  be revised to meet current compliance requirements. Kerin Salen, MD 02/09/2023 9:45:13 AM This report has been signed electronically. Number of Addenda: 0

## 2023-02-10 NOTE — Anesthesia Postprocedure Evaluation (Signed)
Anesthesia Post Note  Patient: Francisco Jones  Procedure(s) Performed: ESOPHAGOGASTRODUODENOSCOPY (EGD) WITH PROPOFOL BIOPSY ESOPHAGEAL BANDING     Patient location during evaluation: PACU Anesthesia Type: MAC Level of consciousness: awake and alert Pain management: pain level controlled Vital Signs Assessment: post-procedure vital signs reviewed and stable Respiratory status: spontaneous breathing, nonlabored ventilation, respiratory function stable and patient connected to nasal cannula oxygen Cardiovascular status: stable and blood pressure returned to baseline Postop Assessment: no apparent nausea or vomiting Anesthetic complications: no   No notable events documented.  Last Vitals:  Vitals:   02/09/23 0950 02/09/23 1000  BP: 110/70 106/70  Pulse: (!) 58 (!) 58  Resp: 17 15  Temp:    SpO2: 99% 97%    Last Pain:  Vitals:   02/09/23 1010  TempSrc:   PainSc: 0-No pain                 Taliyah Watrous S

## 2023-02-11 ENCOUNTER — Encounter (HOSPITAL_COMMUNITY): Payer: Self-pay | Admitting: Gastroenterology

## 2023-02-17 ENCOUNTER — Other Ambulatory Visit: Payer: Self-pay | Admitting: Gastroenterology

## 2023-04-27 ENCOUNTER — Encounter (HOSPITAL_COMMUNITY): Payer: Self-pay | Admitting: Gastroenterology

## 2023-05-03 NOTE — H&P (Signed)
History of Present Illness General:         57 year old male, newly diagnosed cirrhosis, likely related to [facility]        Work-up showed iron saturation 70% with a normal ferritin and negative HFE gene mutation, unremarkable labs for ceruloplasmin/hepatitis C/hepatitis B/ASMA/AMA/alpha-1 antitrypsin.        Normal alpha-fetoprotein from 9/23 with normal LFTs except T. bili of 4.6, PT INR of 13.5/1.3        MRI 04/15/2022: Cirrhosis portal hypertension, splenomegaly, 12 mm liver lesion, recommend follow-up with pre and postcontrast MRI/MRCP in 3 to 6 months EGD 04/22/2022 showed active chronic H. pylori associated gastritis, grade 2 esophageal varices and portal hypertensive gastropathy        Treated with bismuth/doxycycline/metronidazole/PPI on 04/22/2022 Colonoscopy 08/2016, Dr. Matthias Hughs: Screening, normal, repeat in 10 years        He is doing well, he has regular Bms, 1/day, denies blood in stool or black stools, he may have gas pain.        Prior to treatment for H pylori, he had generalized and mid abdominal pain.        Denies acid reflux or heartburn, rarely dry food goes slowly and he may need water, but denies difficulty or pain on swallowing.        He has a good appetite.        He is losing weight but his diet is different, he is avoiding sugar, eating smaller amounts of sugar and salt, eating a lot of vegetables, fish, meat.        Since 12/2021, about 40 lbs weight loss.        He has an appointment with [facility] hepatology on 07/23/2022.  Past Medical History      Hypercholesterolemia.      IBS.      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      Psoriasis.      H. Pylori positive. Surgical History       R Leg - Vein Stripping 1996       L Knee Arthroscopy 2007       Liver Biopsy (see 11/03 lab sheet) 12/2001       Colonoscopy 2018       EGD 04/2022  Family History Father: alive, Lives in [place] Mother: deceased No family hx of colon cancer/polyps or  liver disease.  Social History General:  Tobacco use      cigarettes:  Former smoker     Tobacco history last updated  05/26/2022     Vaping  No EXPOSURE TO PASSIVE SMOKE: no. Alcohol: no. Caffeine: no. Recreational drug use: no. Marital Status: married.  OCCUPATION: [employer] for [employer].  Allergies Penicillin: Allergy  Hospitalization/Major Diagnostic Procedure None in the past year 05/2022 Review of Systems GI PROCEDURE:         Pacemaker/ AICD no.  Artificial heart valves no.  MI/heart attack no.  Abnormal heart rhythm no.  Angina no.  CVA no.  Hypertension no.  Hypotension no.  Asthma, COPD no.  Sleep apnea no.  Seizure disorders no.  Artificial joints no.  Severe DJD no.  Diabetes no.  Significant headaches no.  Vertigo no.  Depression/anxiety no.  Abnormal bleeding no.  Kidney Disease no.  Liver disease no.  Chance of pregnancy no.  Blood transfusion no.  Vital Signs Wt: 196.6, Wt change: -35.8 lbs, Ht: 71.75, BMI: 26.85, Temp: 98.1, Pulse sitting: 61, BP sitting:  120/69. Examination        GENERAL APPEARANCE: Well developed, well nourished, no active distress, pleasant.         SCLERA: anicteric.         CARDIOVASCULAR Normal RRR .         RESPIRATORY Breath sounds normal. Respiration even and unlabored.         ABDOMEN No masses palpated. Liver and spleen not palpated, normal. Bowel sounds normal, Abdomen not distended.         EXTREMITIES: No edema.         NEURO: alert, oriented to time, place and person, normal gait, NO ASTERIXIS.         PSYCH: mood/affect normal.   Assessments 1. Helicobacter pylori infection - A04.8 (Primary) 2. Other cirrhosis of liver - K74.69 3. Liver lesion - K76.9 4. Esophageal varices without bleeding, unspecified esophageal varices type - I85.00 Treatment 1. Helicobacter pylori infection       LAB: H. pylori Stool Ag, EIA (161096) (Ordered for 05/26/2022) Notes: S/P treatment for H pylori, remains aysmptomatic, will check stool for H  pylori Ag to document eradication.    2. Other cirrhosis of liver       LAB: CBC without Diff (Ordered for 09/28/2022)      LAB: Comp Metabolic Panel (Ordered for 09/28/2022)      LAB: AFP, Serum, Tumor Marker ([id number]) (Ordered for 09/28/2022)      LAB: PT (Prothrombin Time) (811914) (Ordered for 09/28/2022) Notes: Appears compensated. NO variceal bleeding, ascites or encephalopathy. Rrepeat EGD recommended in few months at [facility] for banding. Will get labs for CBC, CMP, PT/INR and AFP in 09/2022.    3. Liver lesion       IMAGING: MRI : Abdomen With and without contrast, MRCP in 07/2022  Notes: Will need MRI with and without contrast and MRCP in 3 months, 07/2022.    4. Esophageal varices without bleeding, unspecified esophageal varices type       IMAGING: EGD w/ DILATATION Notes: Will schedule for EGD with banding at [facility].

## 2023-05-03 NOTE — Anesthesia Preprocedure Evaluation (Signed)
Anesthesia Evaluation  Patient identified by MRN, date of birth, ID band Patient awake    Reviewed: Allergy & Precautions, NPO status , Patient's Chart, lab work & pertinent test results, reviewed documented beta blocker date and time   History of Anesthesia Complications Negative for: history of anesthetic complications  Airway Mallampati: II  TM Distance: >3 FB Neck ROM: Full    Dental  (+) Missing,    Pulmonary neg pulmonary ROS   Pulmonary exam normal        Cardiovascular Pt. on home beta blockers Normal cardiovascular exam     Neuro/Psych negative neurological ROS  negative psych ROS   GI/Hepatic negative GI ROS,,,(+) Cirrhosis   Esophageal Varices      Endo/Other  negative endocrine ROS    Renal/GU negative Renal ROS  negative genitourinary   Musculoskeletal negative musculoskeletal ROS (+)    Abdominal   Peds  Hematology negative hematology ROS (+)   Anesthesia Other Findings Day of surgery medications reviewed with patient.  Reproductive/Obstetrics negative OB ROS                              Anesthesia Physical Anesthesia Plan  ASA: 3  Anesthesia Plan: MAC   Post-op Pain Management: Minimal or no pain anticipated   Induction:   PONV Risk Score and Plan: Propofol infusion and Treatment may vary due to age or medical condition  Airway Management Planned: Natural Airway and Nasal Cannula  Additional Equipment: None  Intra-op Plan:   Post-operative Plan:   Informed Consent: I have reviewed the patients History and Physical, chart, labs and discussed the procedure including the risks, benefits and alternatives for the proposed anesthesia with the patient or authorized representative who has indicated his/her understanding and acceptance.       Plan Discussed with: CRNA  Anesthesia Plan Comments:          Anesthesia Quick Evaluation

## 2023-05-04 ENCOUNTER — Encounter (HOSPITAL_COMMUNITY): Payer: Self-pay | Admitting: Gastroenterology

## 2023-05-04 ENCOUNTER — Ambulatory Visit (HOSPITAL_COMMUNITY)
Admission: RE | Admit: 2023-05-04 | Discharge: 2023-05-04 | Disposition: A | Discharge status: Home / Self Care | Payer: 59 | Attending: Gastroenterology | Admitting: Gastroenterology

## 2023-05-04 ENCOUNTER — Other Ambulatory Visit: Payer: Self-pay

## 2023-05-04 ENCOUNTER — Ambulatory Visit (HOSPITAL_COMMUNITY): Payer: 59 | Admitting: Anesthesiology

## 2023-05-04 ENCOUNTER — Encounter (HOSPITAL_COMMUNITY)
Admission: RE | Disposition: A | Discharge status: Home / Self Care | Payer: Self-pay | Source: Home / Self Care | Attending: Gastroenterology

## 2023-05-04 ENCOUNTER — Ambulatory Visit (HOSPITAL_BASED_OUTPATIENT_CLINIC_OR_DEPARTMENT_OTHER): Payer: 59 | Admitting: Anesthesiology

## 2023-05-04 DIAGNOSIS — Z8619 Personal history of other infectious and parasitic diseases: Secondary | ICD-10-CM | POA: Diagnosis not present

## 2023-05-04 DIAGNOSIS — I8510 Secondary esophageal varices without bleeding: Secondary | ICD-10-CM | POA: Diagnosis present

## 2023-05-04 DIAGNOSIS — K766 Portal hypertension: Secondary | ICD-10-CM | POA: Diagnosis not present

## 2023-05-04 DIAGNOSIS — K3189 Other diseases of stomach and duodenum: Secondary | ICD-10-CM | POA: Diagnosis not present

## 2023-05-04 DIAGNOSIS — I8500 Esophageal varices without bleeding: Secondary | ICD-10-CM | POA: Diagnosis not present

## 2023-05-04 DIAGNOSIS — K7460 Unspecified cirrhosis of liver: Secondary | ICD-10-CM | POA: Diagnosis not present

## 2023-05-04 HISTORY — PX: ESOPHAGEAL BANDING: SHX5518

## 2023-05-04 HISTORY — PX: ESOPHAGOGASTRODUODENOSCOPY (EGD) WITH PROPOFOL: SHX5813

## 2023-05-04 SURGERY — ESOPHAGOGASTRODUODENOSCOPY (EGD) WITH PROPOFOL
Anesthesia: Monitor Anesthesia Care

## 2023-05-04 MED ORDER — PROPOFOL 500 MG/50ML IV EMUL: INTRAVENOUS | Status: DC | PRN

## 2023-05-04 MED ORDER — PROPOFOL 500 MG/50ML IV EMUL
INTRAVENOUS | Status: AC
  Filled 2023-05-04: qty 50

## 2023-05-04 MED ORDER — PROPOFOL 10 MG/ML IV BOLUS
INTRAVENOUS | Status: DC | PRN
  Administered 2023-05-04: 50 mg via INTRAVENOUS
  Administered 2023-05-04: 60 mg via INTRAVENOUS
  Administered 2023-05-04 (×2): 50 mg via INTRAVENOUS

## 2023-05-04 MED ORDER — GLYCOPYRROLATE 0.2 MG/ML IJ SOLN
INTRAMUSCULAR | Status: DC | PRN
  Administered 2023-05-04: .2 mg via INTRAVENOUS

## 2023-05-04 MED ORDER — PROPOFOL 10 MG/ML IV BOLUS
INTRAVENOUS | Status: AC
Start: 2023-05-04 — End: ?
  Filled 2023-05-04: qty 20

## 2023-05-04 MED ORDER — SODIUM CHLORIDE 0.9 % IV SOLN: INTRAVENOUS | Status: DC

## 2023-05-04 MED ORDER — PROPOFOL 1000 MG/100ML IV EMUL
INTRAVENOUS | Status: AC
  Filled 2023-05-04: qty 100

## 2023-05-04 MED ORDER — LIDOCAINE HCL (CARDIAC) PF 100 MG/5ML IV SOSY
PREFILLED_SYRINGE | INTRAVENOUS | Status: DC | PRN
  Administered 2023-05-04: 100 mg via INTRAVENOUS

## 2023-05-04 MED ORDER — PROPOFOL 10 MG/ML IV BOLUS
INTRAVENOUS | Status: AC
  Filled 2023-05-04: qty 20

## 2023-05-04 SURGICAL SUPPLY — 15 items

## 2023-05-04 NOTE — Transfer of Care (Signed)
Immediate Anesthesia Transfer of Care Note  Patient: Francisco Jones  Procedure(s) Performed: ESOPHAGOGASTRODUODENOSCOPY (EGD) WITH PROPOFOL with Banding ESOPHAGEAL BANDING  Patient Location: PACU  Anesthesia Type:MAC  Level of Consciousness: awake  Airway & Oxygen Therapy: Patient Spontanous Breathing  Post-op Assessment: Report given to RN and Post -op Vital signs reviewed and stable  Post vital signs: Reviewed and stable  Last Vitals:  Vitals Value Taken Time  BP 138/83 05/04/23 0900  Temp 36.7 C 05/04/23 0853  Pulse 60 05/04/23 0902  Resp 13 05/04/23 0902  SpO2 99 % 05/04/23 0902  Vitals shown include unfiled device data.  Last Pain:  Vitals:   05/04/23 0900  TempSrc:   PainSc: 0-No pain         Complications: No notable events documented.

## 2023-05-04 NOTE — Op Note (Signed)
[facility] Patient Name: Francisco Jones Procedure Date: 05/04/2023 MRN: [id number] Attending MD: Kerin Salen , MD, [id number] Date of Birth: 1965/09/30 CSN: [id number] Age: 57 Admit Type: Outpatient Procedure:                Upper GI endoscopy Indications:              For therapy of esophageal varices Providers:                Kerin Salen, MD, Jacquelyn "Jaci" Clelia Croft, RN, Salley Scarlet, Technician, Yvonne Kendall Parsons,CRNA Referring MD:             Blair Heys, MD, Graciella Freer ,MD ([facility]                            GI) Medicines:                Monitored Anesthesia Care Complications:            No immediate complications. Estimated Blood Loss:     Estimated blood loss: none. Procedure:                Pre-Anesthesia Assessment:                           - Prior to the procedure, a History and Physical                            was performed, and patient medications and                            allergies were reviewed. The patient's tolerance of                            previous anesthesia was also reviewed. The risks                            and benefits of the procedure and the sedation                            options and risks were discussed with the patient.                            All questions were answered, and informed consent                            was obtained. Prior Anticoagulants: The patient has                            taken no anticoagulant or antiplatelet agents. ASA                            Grade Assessment: II - A patient with mild systemic  disease. After reviewing the risks and benefits,                            the patient was deemed in satisfactory condition to                            undergo the procedure.                           After obtaining informed consent, the endoscope was                            passed under direct vision. Throughout the                             procedure, the patient's blood pressure, pulse, and                            oxygen saturations were monitored continuously. The                            GIF-H190 ([id number]) Olympus endoscope was introduced                            through the mouth, and advanced to the second part                            of duodenum. The upper GI endoscopy was                            accomplished without difficulty. The patient                            tolerated the procedure well. Scope In: Scope Out: Findings:      Grade II varices were found in the lower third of the esophagus. They       were medium in size. Four bands were successfully placed with complete       eradication, resulting in deflation of varices. There was no bleeding       during and at the end of the procedure.      Moderate portal hypertensive gastropathy was found in the entire       examined stomach.      The cardia and gastric fundus were normal on retroflexion.      The examined duodenum was normal. Impression:               - Grade II esophageal varices. Completely                            eradicated. Banded.                           - Portal hypertensive gastropathy.                           - Normal examined duodenum.                           -  No specimens collected. Moderate Sedation:      Patient did not receive moderate sedation for this procedure, but       instead received monitored anesthesia care. Recommendation:           - Patient has a contact number available for                            emergencies. The signs and symptoms of potential                            delayed complications were discussed with the                            patient. Return to normal activities tomorrow.                            Written discharge instructions were provided to the                            patient.                           - Clear liquid diet for 6 hours, thereafter ok to                             resume regular diet.                           - Repeat upper endoscopy in 3 months for                            retreatment. Procedure Code(s):        --- Professional ---                           [phone], Esophagogastroduodenoscopy, flexible,                            transoral; with band ligation of esophageal/gastric                            varices Diagnosis Code(s):        --- Professional ---                           I85.00, Esophageal varices without bleeding                           K76.6, Portal hypertension                           K31.89, Other diseases of stomach and duodenum CPT copyright 2022 American Medical Association. All rights reserved. The codes documented in this report are preliminary and upon coder review may  be revised to meet current compliance requirements. Kerin Salen, MD 05/04/2023 8:51:48 AM This report has been signed electronically. Number of Addenda: 0

## 2023-05-04 NOTE — Interval H&P Note (Signed)
History and Physical Interval Note: 57/male with Cirrhosis, esophageal varices for banding with propofol.  05/04/2023 8:21 AM  Francisco Jones  has presented today for EGD with banding, with the diagnosis of esophageal varices  and cirrhosis of liver.  The various methods of treatment have been discussed with the patient and family. After consideration of risks, benefits and other options for treatment, the patient has consented to  Procedure(s): ESOPHAGOGASTRODUODENOSCOPY (EGD) WITH PROPOFOL with Banding (N/A) as a surgical intervention.  The patient's history has been reviewed, patient examined, no change in status, stable for surgery.  I have reviewed the patient's chart and labs.  Questions were answered to the patient's satisfaction.     Kerin Salen

## 2023-05-04 NOTE — Anesthesia Procedure Notes (Signed)
Procedure Name: MAC Date/Time: 05/04/2023 8:40 AM  Performed by: Floydene Flock, CRNAPre-anesthesia Checklist: Patient identified, Emergency Drugs available, Suction available, Patient being monitored and Timeout performed Patient Re-evaluated:Patient Re-evaluated prior to induction Oxygen Delivery Method: Simple face mask Preoxygenation: Pre-oxygenation with 100% oxygen Induction Type: IV induction

## 2023-05-04 NOTE — Anesthesia Postprocedure Evaluation (Signed)
Anesthesia Post Note  Patient: Francisco Jones  Procedure(s) Performed: ESOPHAGOGASTRODUODENOSCOPY (EGD) WITH PROPOFOL with Banding ESOPHAGEAL BANDING     Patient location during evaluation: PACU Anesthesia Type: MAC Level of consciousness: awake and alert Pain management: pain level controlled Vital Signs Assessment: post-procedure vital signs reviewed and stable Respiratory status: spontaneous breathing, nonlabored ventilation and respiratory function stable Cardiovascular status: blood pressure returned to baseline Postop Assessment: no apparent nausea or vomiting Anesthetic complications: no   No notable events documented.  Last Vitals:  Vitals:   05/04/23 0900 05/04/23 0910  BP: 138/83 (!) 140/85  Pulse: (!) 55 (!) 57  Resp: 16 16  Temp:    SpO2: 99% 100%    Last Pain:  Vitals:   05/04/23 0910  TempSrc:   PainSc: 0-No pain                 Shanda Howells

## 2023-05-04 NOTE — Discharge Instructions (Signed)
YOU HAD AN ENDOSCOPIC PROCEDURE TODAY: Refer to the procedure report and other information in the discharge instructions given to you for any specific questions about what was found during the examination. If this information does not answer your questions, please call [facility] office at [phone] to clarify.   YOU SHOULD EXPECT: Some feelings of bloating in the abdomen. Passage of more gas than usual. Walking can help get rid of the air that was put into your GI tract during the procedure and reduce the bloating. If you had a lower endoscopy (such as a colonoscopy or flexible sigmoidoscopy) you may notice spotting of blood in your stool or on the toilet paper. Some abdominal soreness may be present for a day or two, also.  DIET: Your first meal following the procedure should be a light meal and then it is ok to progress to your normal diet. A half-sandwich or bowl of soup is an example of a good first meal. Heavy or fried foods are harder to digest and may make you feel nauseous or bloated. Drink plenty of fluids but you should avoid alcoholic beverages for 24 hours. If you had a esophageal dilation, please see attached instructions for diet.    ACTIVITY: Your care partner should take you home directly after the procedure. You should plan to take it easy, moving slowly for the rest of the day. You can resume normal activity the day after the procedure however YOU SHOULD NOT DRIVE, use power tools, machinery or perform tasks that involve climbing or major physical exertion for 24 hours (because of the sedation medicines used during the test).   SYMPTOMS TO REPORT IMMEDIATELY: A gastroenterologist can be reached at any hour. Please call [phone]  for any of the following symptoms:  Following lower endoscopy (colonoscopy, flexible sigmoidoscopy) Excessive amounts of blood in the stool  Significant tenderness, worsening of abdominal pains  Swelling of the abdomen that is new, acute  Fever of 100  or higher  Following upper endoscopy (EGD, EUS, ERCP, esophageal dilation) Vomiting of blood or coffee ground material  New, significant abdominal pain  New, significant chest pain or pain under the shoulder blades  Painful or persistently difficult swallowing  New shortness of breath  Black, tarry-looking or red, bloody stools  FOLLOW UP:  If any biopsies were taken you will be contacted by phone or by letter within the next 1-3 weeks. Call [phone]  if you have not heard about the biopsies in 3 weeks.  Please also call with any specific questions about appointments or follow up tests. YOU HAD AN ENDOSCOPIC PROCEDURE TODAY: Refer to the procedure report and other information in the discharge instructions given to you for any specific questions about what was found during the examination. If this information does not answer your questions, please call [facility] office at [phone] to clarify. YOU HAD AN ENDOSCOPIC PROCEDURE TODAY: Refer to the procedure report and other information in the discharge instructions given to you for any specific questions about what was found during the examination. If this information does not answer your questions, please call [facility] office at [phone] to clarify.   YOU SHOULD EXPECT: Some feelings of bloating in the abdomen. Passage of more gas than usual. Walking can help get rid of the air that was put into your GI tract during the procedure and reduce the bloating. If you had a lower endoscopy (such as a colonoscopy or flexible sigmoidoscopy) you may notice spotting of blood in your stool  or on the toilet paper. Some abdominal soreness may be present for a day or two, also.  DIET: Your first meal following the procedure should be a light meal and then it is ok to progress to your normal diet. A half-sandwich or bowl of soup is an example of a good first meal. Heavy or fried foods are harder to digest and may make you feel nauseous or bloated. Drink plenty  of fluids but you should avoid alcoholic beverages for 24 hours. If you had a esophageal dilation, please see attached instructions for diet.    ACTIVITY: Your care partner should take you home directly after the procedure. You should plan to take it easy, moving slowly for the rest of the day. You can resume normal activity the day after the procedure however YOU SHOULD NOT DRIVE, use power tools, machinery or perform tasks that involve climbing or major physical exertion for 24 hours (because of the sedation medicines used during the test).   SYMPTOMS TO REPORT IMMEDIATELY: A gastroenterologist can be reached at any hour. Please call [phone]  for any of the following symptoms:  Following lower endoscopy (colonoscopy, flexible sigmoidoscopy) Excessive amounts of blood in the stool  Significant tenderness, worsening of abdominal pains  Swelling of the abdomen that is new, acute  Fever of 100 or higher  Following upper endoscopy (EGD, EUS, ERCP, esophageal dilation) Vomiting of blood or coffee ground material  New, significant abdominal pain  New, significant chest pain or pain under the shoulder blades  Painful or persistently difficult swallowing  New shortness of breath  Black, tarry-looking or red, bloody stools  FOLLOW UP:  If any biopsies were taken you will be contacted by phone or by letter within the next 1-3 weeks. Call [phone]  if you have not heard about the biopsies in 3 weeks.  Please also call with any specific questions about appointments or follow up tests. YOU HAD AN ENDOSCOPIC PROCEDURE TODAY: Refer to the procedure report and other information in the discharge instructions given to you for any specific questions about what was found during the examination. If this information does not answer your questions, please call [facility] office at [phone] to clarify.

## 2023-05-05 NOTE — Progress Notes (Signed)
Reported pain of 4/10 with swallows only. Did have ESO banding done for varices. Patient educated of expected finding at this time. Encouraged to eat soft foods and to call Dr. Laurey Morale office if pain worsens or does not improve over time.

## 2023-05-07 ENCOUNTER — Encounter (HOSPITAL_COMMUNITY): Payer: Self-pay | Admitting: Gastroenterology

## 2023-07-12 ENCOUNTER — Other Ambulatory Visit: Payer: Self-pay | Admitting: Gastroenterology

## 2023-07-12 DIAGNOSIS — K769 Liver disease, unspecified: Secondary | ICD-10-CM

## 2023-07-12 DIAGNOSIS — K7469 Other cirrhosis of liver: Secondary | ICD-10-CM

## 2024-01-07 ENCOUNTER — Other Ambulatory Visit: Payer: Self-pay | Admitting: Gastroenterology

## 2024-01-21 IMAGING — US US ABDOMEN COMPLETE
1 series · 14 of 25 positions shown · non-contrast
Comparison: Abdominal ultrasound 04/30/2016

CLINICAL DATA: Elevated lipase

EXAM:
ABDOMEN ULTRASOUND COMPLETE

[Series 1: us abdomen complete · 0.15mm/px · 14 of 80 slices shown]
[im 1/80]
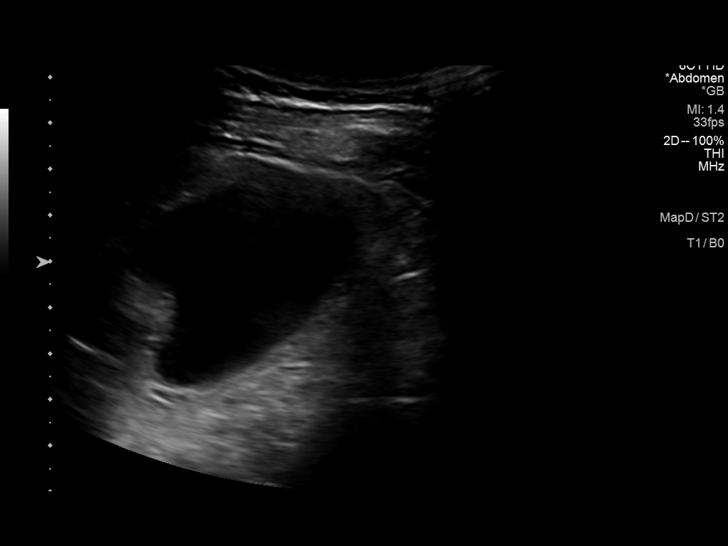
[im 7/80]
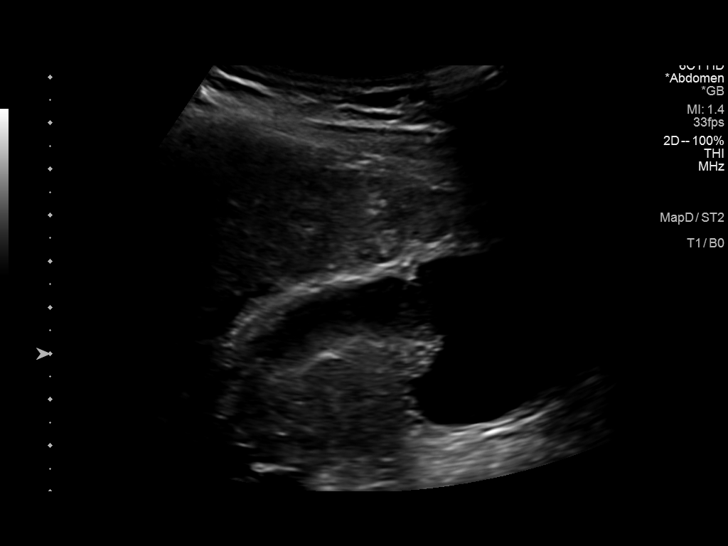
[im 14/80]
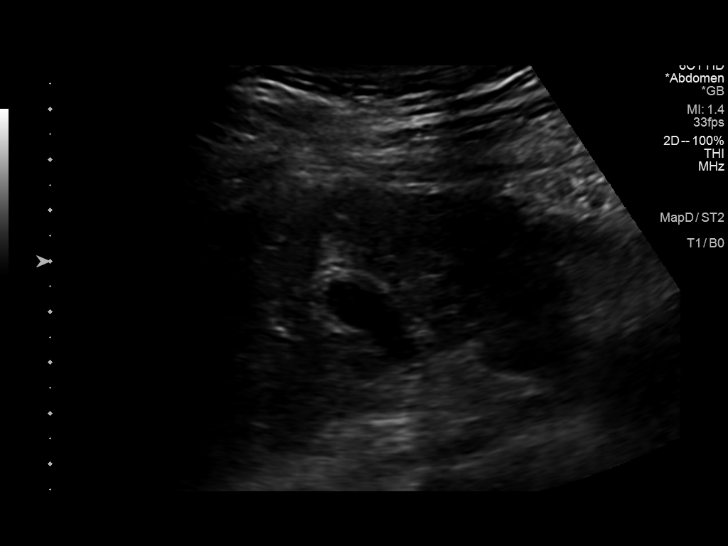
[im 20/80]
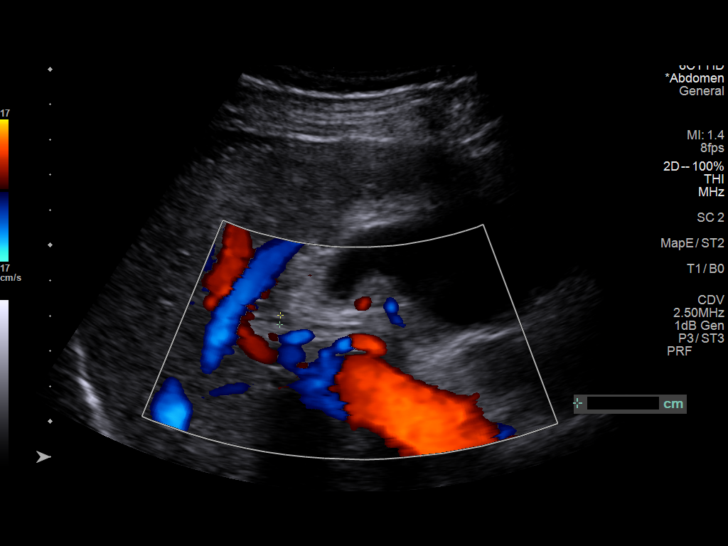
[im 27/80]
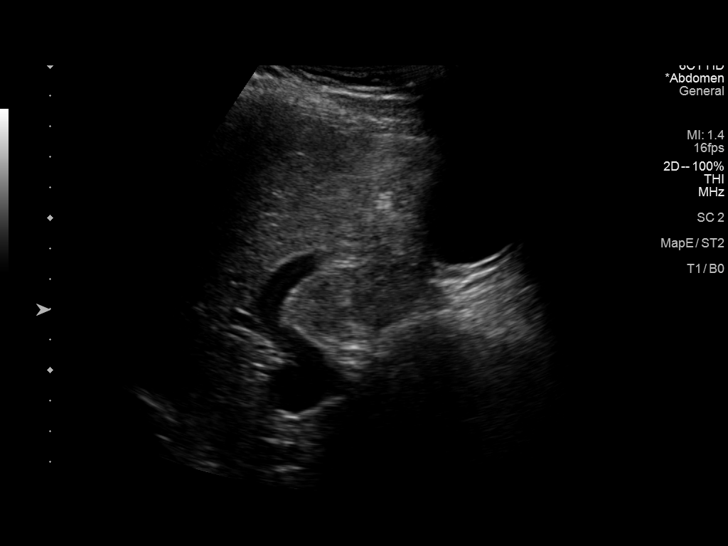
[im 30/80]
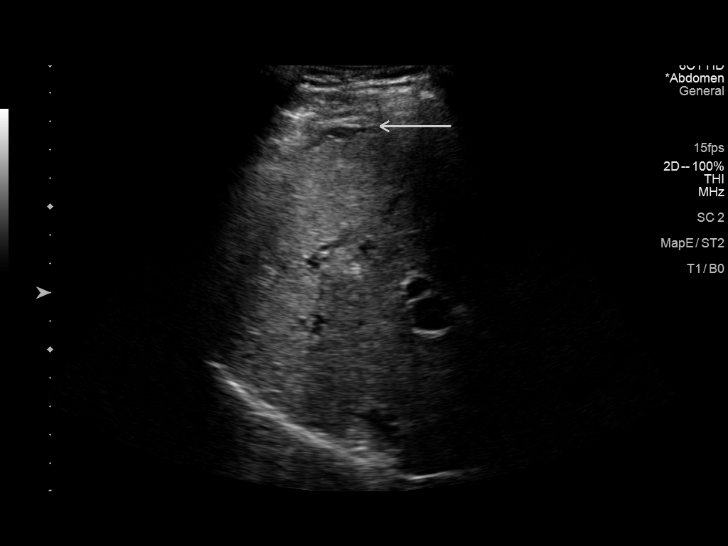
[im 37/80]
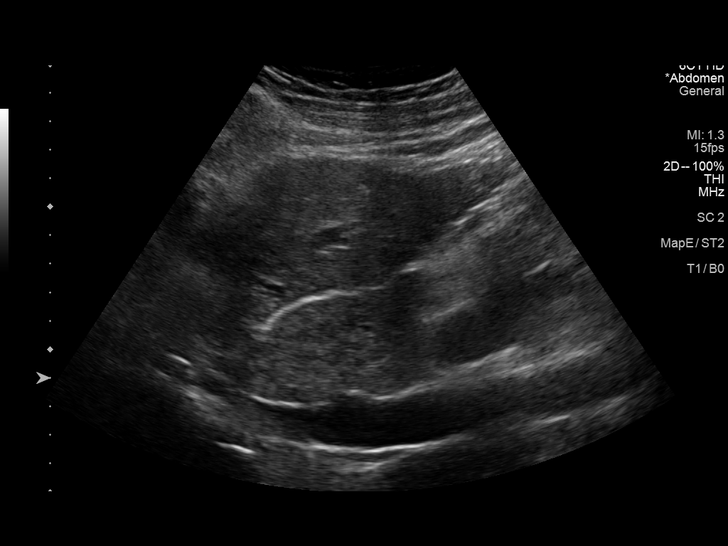
[im 43/80]
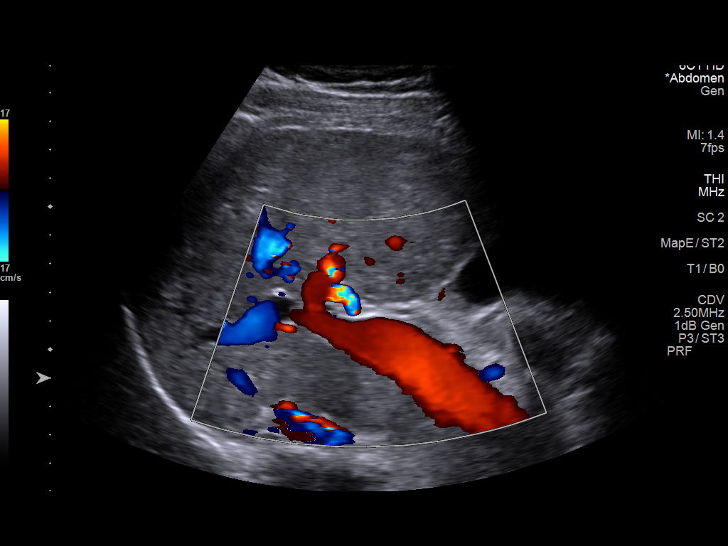
[im 50/80]
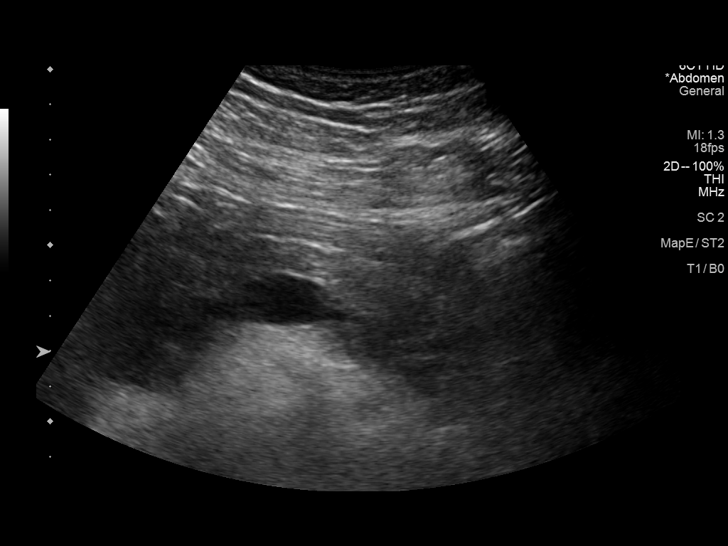
[im 53/80]
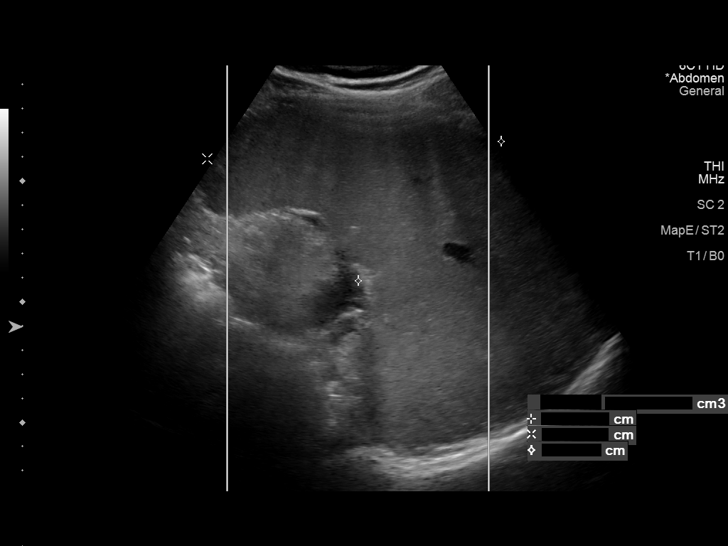
[im 60/80]
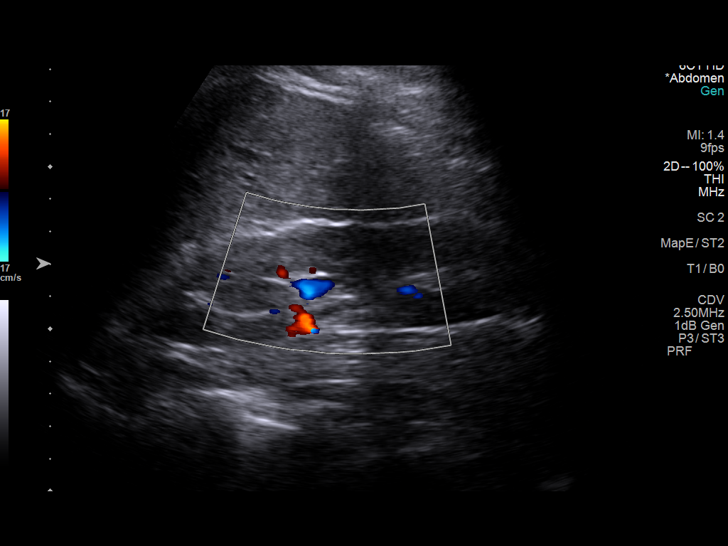
[im 66/80]
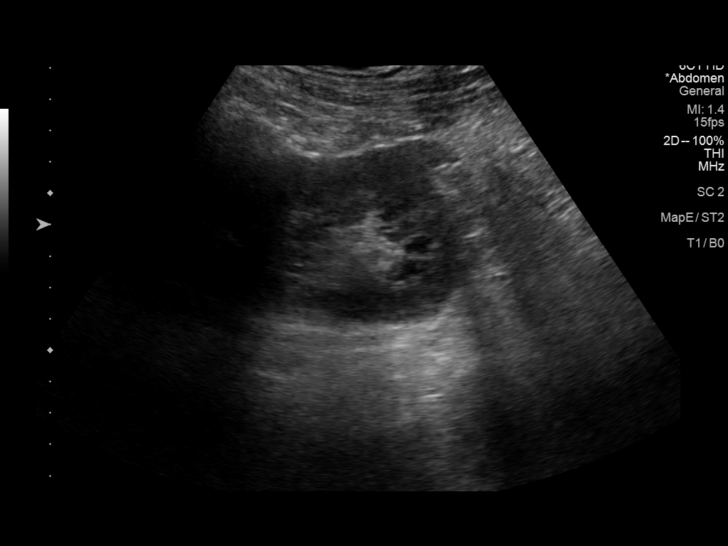
[im 73/80]
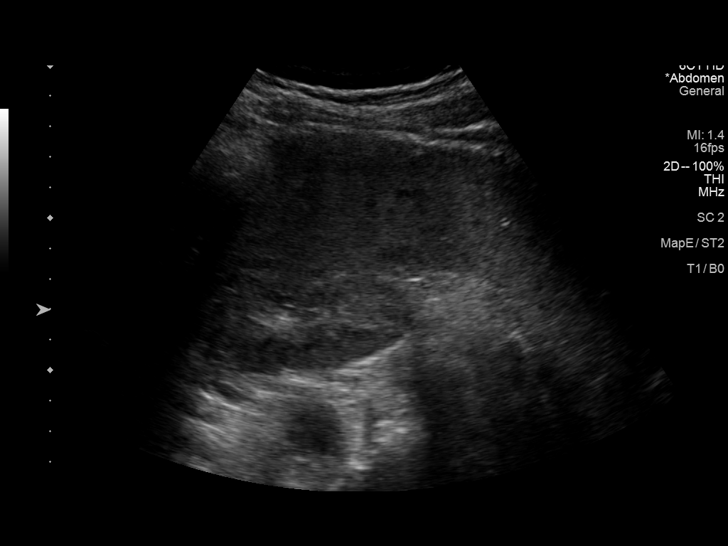
[im 80/80]
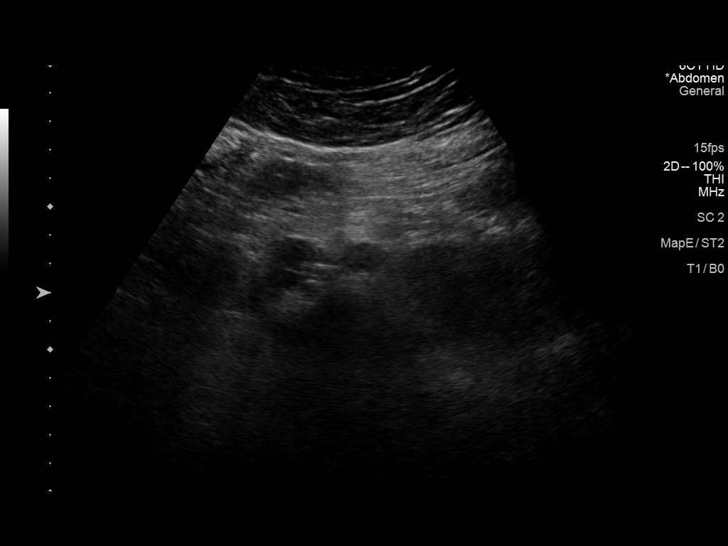

[14 of 25 positions shown; findings below may reference images not displayed]

FINDINGS: Gallbladder: No gallstones or wall thickening visualized. No
sonographic Murphy sign noted by sonographer.

Common bile duct: Diameter: 3 mm

Liver: Inhomogeneous, coarse echotexture of the parenchyma with
nodular contour. No focal mass identified. Portal vein is patent on
color Doppler imaging with normal direction of blood flow towards
the liver.

IVC: No abnormality visualized.

Pancreas: Visualized portion unremarkable.

Spleen: Enlarged measuring 18.6 cm in length

Right Kidney: Length: 13 cm. Echogenicity within normal limits. No
mass or hydronephrosis visualized.

Left Kidney: Length: 13.1 cm. Echogenicity within normal limits. No
mass or hydronephrosis visualized.

Abdominal aorta: No aneurysm visualized.

Other findings: Trace perihepatic ascites.
IMPRESSION: 1. Abnormal appearance of the liver suggesting cirrhosis. No focal
hepatic mass visualized.
2. Splenomegaly.
3. Trace perihepatic ascites.

## 2024-02-15 ENCOUNTER — Encounter (HOSPITAL_COMMUNITY): Payer: Self-pay | Admitting: Gastroenterology

## 2024-02-15 ENCOUNTER — Other Ambulatory Visit: Payer: Self-pay

## 2024-02-21 NOTE — H&P (Signed)
 History of Present Illness General:         58 year old male, newly diagnosed cirrhosis, likely related to [facility]        Work-up showed iron saturation 70% with a normal ferritin and negative HFE gene mutation, unremarkable labs for ceruloplasmin/hepatitis C/hepatitis B/ASMA/AMA/alpha-1 antitrypsin.        Normal alpha-fetoprotein from 9/23 with normal LFTs except T. bili of 4.6, PT INR of 13.5/1.3        MRI 04/15/2022: Cirrhosis portal hypertension, splenomegaly, 12 mm liver lesion, recommend follow-up with pre and postcontrast MRI/MRCP in 3 to 6 months        EGD 04/22/2022 showed active chronic H. 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 Donnald: Screening, normal, repeat in 10 years        He is doing well, he has regular Bms, 1/day, denies blood in stool or black stools, he may have gas pain.        Prior to treatment for H pylori, he had generalized and mid abdominal pain.        Denies acid reflux or heartburn, rarely dry food goes slowly and he may need water, but denies difficulty or pain on swallowing.        He has a good appetite.        He is losing weight but his diet is different, he is avoiding sugar, eating smaller amounts of sugar and salt, eating a lot of vegetables, fish, meat.        Since 12/2021, about 40 lbs weight loss.         Vital Signs Wt: 196.6, Wt change: -35.8 lbs, Ht: 71.75, BMI: 26.85, Temp: 98.1, Pulse sitting: 61, BP sitting: 120/69. Examination        GENERAL APPEARANCE: Well developed, well nourished, no active distress, pleasant.         SCLERA: anicteric.         CARDIOVASCULAR Normal RRR .         RESPIRATORY Breath sounds normal. Respiration even and unlabored.         ABDOMEN No masses palpated. Liver and spleen not palpated, normal. Bowel sounds normal, Abdomen not distended.         EXTREMITIES: No  edema.         NEURO: alert, oriented to time, place and person, normal gait, NO ASTERIXIS.         PSYCH: mood/affect normal.   Assessments 1. Helicobacter pylori infection - A04.8 (Primary) 2. Other cirrhosis of liver - K74.69 3. Liver lesion - K76.9 4. Esophageal varices without bleeding, unspecified esophageal varices type - I85.00 Treatment 1. Helicobacter pylori infection       LAB: H. pylori Stool Ag, EIA ([id number]) (Ordered for 05/26/2022) Notes: S/P treatment for H pylori, remains aysmptomatic, will check stool for H pylori Ag to document eradication.    2. Other cirrhosis of liver       LAB: CBC without Diff (Ordered for 09/28/2022)      LAB: Comp Metabolic Panel (Ordered for 09/28/2022)      LAB: AFP, Serum, Tumor Marker ([id number]) (Ordered for 09/28/2022)      LAB: PT (Prothrombin Time) ([id number]) (Ordered for 09/28/2022) Notes: Appears compensated. NO variceal bleeding, ascites or encephalopathy. Repeat EGD recommended at [facility] for banding. Will get labs for CBC, CMP, PT/INR and AFP  in 09/2022.    3. Liver lesion       IMAGING: MRI : Abdomen With and without contrast, MRCP in 07/2022  Notes: Will need MRI with and without contrast and MRCP in 3 months, 07/2022.    4. Esophageal varices without bleeding, unspecified esophageal varices type       IMAGING: EGD w/ banding

## 2024-02-22 ENCOUNTER — Ambulatory Visit (HOSPITAL_COMMUNITY)
Admission: RE | Admit: 2024-02-22 | Discharge: 2024-02-22 | Disposition: A | Discharge status: Home / Self Care | Attending: Gastroenterology | Admitting: Gastroenterology

## 2024-02-22 ENCOUNTER — Ambulatory Visit (HOSPITAL_COMMUNITY): Admitting: Certified Registered"

## 2024-02-22 ENCOUNTER — Other Ambulatory Visit: Payer: Self-pay

## 2024-02-22 ENCOUNTER — Encounter (HOSPITAL_COMMUNITY): Payer: Self-pay | Admitting: Gastroenterology

## 2024-02-22 ENCOUNTER — Encounter (HOSPITAL_COMMUNITY)
Admission: RE | Disposition: A | Discharge status: Home / Self Care | Payer: Self-pay | Source: Home / Self Care | Attending: Gastroenterology

## 2024-02-22 DIAGNOSIS — I8510 Secondary esophageal varices without bleeding: Secondary | ICD-10-CM | POA: Diagnosis not present

## 2024-02-22 DIAGNOSIS — K766 Portal hypertension: Secondary | ICD-10-CM | POA: Insufficient documentation

## 2024-02-22 DIAGNOSIS — I10 Essential (primary) hypertension: Secondary | ICD-10-CM | POA: Diagnosis not present

## 2024-02-22 DIAGNOSIS — K3189 Other diseases of stomach and duodenum: Secondary | ICD-10-CM | POA: Diagnosis not present

## 2024-02-22 DIAGNOSIS — Z79899 Other long term (current) drug therapy: Secondary | ICD-10-CM | POA: Insufficient documentation

## 2024-02-22 HISTORY — PX: ESOPHAGOGASTRODUODENOSCOPY: SHX5428

## 2024-02-22 HISTORY — PX: ESOPHAGEAL BANDING: SHX5518

## 2024-02-22 HISTORY — DX: Thrombocytopenia, unspecified: D69.6

## 2024-02-22 HISTORY — DX: Psoriasis, unspecified: L40.9

## 2024-02-22 HISTORY — DX: Unspecified cirrhosis of liver: K74.60

## 2024-02-22 HISTORY — DX: Essential (primary) hypertension: I10

## 2024-02-22 SURGERY — EGD (ESOPHAGOGASTRODUODENOSCOPY)
Anesthesia: Monitor Anesthesia Care

## 2024-02-22 MED ORDER — LIDOCAINE 2% (20 MG/ML) 5 ML SYRINGE
INTRAMUSCULAR | Status: DC | PRN
  Administered 2024-02-22 (×2): 50 mg via INTRAVENOUS

## 2024-02-22 MED ORDER — SODIUM CHLORIDE 0.9 % IV SOLN
INTRAVENOUS | Status: AC | PRN
  Administered 2024-02-22 (×2): 500 mL via INTRAMUSCULAR

## 2024-02-22 MED ORDER — SODIUM CHLORIDE 0.9 % IV SOLN: INTRAVENOUS | Status: DC

## 2024-02-22 MED ORDER — EPHEDRINE SULFATE (PRESSORS) 50 MG/ML IJ SOLN
INTRAMUSCULAR | Status: DC | PRN
Start: 2024-02-22 — End: 2024-02-22
  Administered 2024-02-22 (×2): 10 mg via INTRAVENOUS

## 2024-02-22 MED ORDER — PHENYLEPHRINE HCL (PRESSORS) 10 MG/ML IV SOLN
INTRAVENOUS | Status: DC | PRN
  Administered 2024-02-22 (×2): 40 ug via INTRAVENOUS

## 2024-02-22 MED ORDER — PROPOFOL 10 MG/ML IV BOLUS
INTRAVENOUS | Status: DC | PRN
  Administered 2024-02-22 (×4): 50 mg via INTRAVENOUS

## 2024-02-22 MED ORDER — PROPOFOL 500 MG/50ML IV EMUL
INTRAVENOUS | Status: DC | PRN
Start: 2024-02-22 — End: 2024-02-22
  Administered 2024-02-22 (×2): 200 ug/kg/min via INTRAVENOUS

## 2024-02-22 MED ORDER — PROPOFOL 10 MG/ML IV BOLUS
INTRAVENOUS | Status: AC
  Filled 2024-02-22: qty 20

## 2024-02-22 NOTE — Interval H&P Note (Signed)
 History and Physical Interval Note: 58 year old male with history of cirrhosis and esophageal varices with EGD with possible banding with propofol .  02/22/2024 10:54 AM  Francisco Jones  has presented today for EGD with possible banding with propofol  with the diagnosis of K74.60 Cirrhosis.  The various methods of treatment have been discussed with the patient and family. After consideration of risks, benefits and other options for treatment, the patient has consented to  Procedure(s): EGD (ESOPHAGOGASTRODUODENOSCOPY) (N/A) ESOPHAGOSCOPY, WITH ESOPHAGEAL VARICES BAND LIGATION (N/A) as a surgical intervention.  The patient's history has been reviewed, patient examined, no change in status, stable for surgery.  I have reviewed the patient's chart and labs.  Questions were answered to the patient's satisfaction.     Estelita Manas

## 2024-02-22 NOTE — Discharge Instructions (Signed)

## 2024-02-22 NOTE — Anesthesia Preprocedure Evaluation (Addendum)
 Anesthesia Evaluation  Patient identified by MRN, date of birth, ID band Patient awake    Reviewed: Allergy  & Precautions, H&P , NPO status , Patient's Chart, lab work & pertinent test results  Airway Mallampati: II  TM Distance: >3 FB Neck ROM: Full    Dental no notable dental hx. (+) Teeth Intact, Dental Advisory Given   Pulmonary neg pulmonary ROS   Pulmonary exam normal breath sounds clear to auscultation       Cardiovascular hypertension, Pt. on medications and Pt. on home beta blockers  Rhythm:Regular Rate:Normal     Neuro/Psych negative neurological ROS  negative psych ROS   GI/Hepatic negative GI ROS,,,(+) Cirrhosis   Esophageal Varices      Endo/Other  negative endocrine ROS    Renal/GU negative Renal ROS  negative genitourinary   Musculoskeletal   Abdominal   Peds  Hematology negative hematology ROS (+)   Anesthesia Other Findings   Reproductive/Obstetrics negative OB ROS                              Anesthesia Physical Anesthesia Plan  ASA: 3  Anesthesia Plan: MAC   Post-op Pain Management: Minimal or no pain anticipated   Induction: Intravenous  PONV Risk Score and Plan: 1 and Propofol  infusion  Airway Management Planned: Natural Airway and Simple Face Mask  Additional Equipment:   Intra-op Plan:   Post-operative Plan:   Informed Consent: I have reviewed the patients History and Physical, chart, labs and discussed the procedure including the risks, benefits and alternatives for the proposed anesthesia with the patient or authorized representative who has indicated his/her understanding and acceptance.     Dental advisory given  Plan Discussed with: CRNA  Anesthesia Plan Comments:          Anesthesia Quick Evaluation

## 2024-02-22 NOTE — Op Note (Signed)
 [facility] Patient Name: Francisco Jones Procedure Date: 02/22/2024 MRN: [id number] Attending MD: Estelita Manas , MD, [id number] Date of Birth: June 07, 1966 CSN: [id number] Age: 58 Admit Type: Outpatient Procedure:                Upper GI endoscopy Indications:              For therapy of esophageal varices Providers:                Estelita Manas, MD, Gregoria Pierce, RN, Haskel Chris, Technician Referring MD:             Lamar Bernhardt, MD Medicines:                Monitored Anesthesia Care Complications:            No immediate complications. Estimated Blood Loss:     Estimated blood loss: none. Procedure:                Pre-Anesthesia Assessment:                           - Prior to the procedure, a History and Physical                            was performed, and patient medications and                            allergies were reviewed. The patient's tolerance of                            previous anesthesia was also reviewed. The risks                            and benefits of the procedure and the sedation                            options and risks were discussed with the patient.                            All questions were answered, and informed consent                            was obtained. Prior Anticoagulants: The patient has                            taken no anticoagulant or antiplatelet agents. ASA                            Grade Assessment: III - A patient with severe                            systemic disease. After reviewing the risks and  benefits, the patient was deemed in satisfactory                            condition to undergo the procedure.                           After obtaining informed consent, the endoscope was                            passed under direct vision. Throughout the                            procedure, the patient's blood pressure, pulse, and                             oxygen saturations were monitored continuously. The                            [id number]-[id number] ([id number]) Olympus endoscope was introduced                            through the mouth, and advanced to the second part                            of duodenum. The upper GI endoscopy was                            accomplished without difficulty. The patient                            tolerated the procedure well. Scope In: Scope Out: Findings:      Grade II varices were found in the middle third of the esophagus. They       were medium in size. Two bands were successfully placed with complete       eradication, resulting in deflation of varices. There was no bleeding       during and at the end of the procedure.      Moderate portal hypertensive gastropathy was found in the entire       examined stomach.      The cardia and gastric fundus were normal on retroflexion.      The examined duodenum was normal. Impression:               - Grade II esophageal varices. Completely                            eradicated. Banded.                           - Portal hypertensive gastropathy.                           - Normal examined duodenum.                           - No specimens collected. Moderate Sedation:  Patient did not receive moderate sedation for this procedure, but       instead received monitored anesthesia care. Recommendation:           - Patient has a contact number available for                            emergencies. The signs and symptoms of potential                            delayed complications were discussed with the                            patient. Return to normal activities tomorrow.                            Written discharge instructions were provided to the                            patient.                           - Clear liquid diet for 6 hours.                           - Advance diet as tolerated today.                           - Continue present  medications.                           - Repeat upper endoscopy in 1 year for retreatment. Procedure Code(s):        --- Professional ---                           [phone], Esophagogastroduodenoscopy, flexible,                            transoral; with band ligation of esophageal/gastric                            varices Diagnosis Code(s):        --- Professional ---                           I85.00, Esophageal varices without bleeding                           K76.6, Portal hypertension                           K31.89, Other diseases of stomach and duodenum CPT copyright 2022 [employer]. All rights reserved. The codes documented in this report are preliminary and upon coder review may  be revised to meet current compliance requirements. Estelita Manas, MD 02/22/2024 11:19:47 AM This report has been signed electronically. Number of Addenda: 0

## 2024-02-22 NOTE — Transfer of Care (Signed)
 Immediate Anesthesia Transfer of Care Note  Patient: Francisco Jones  Procedure(s) Performed: EGD (ESOPHAGOGASTRODUODENOSCOPY) ESOPHAGOSCOPY, WITH ESOPHAGEAL VARICES BAND LIGATION  Patient Location: PACU  Anesthesia Type:MAC  Level of Consciousness: drowsy  Airway & Oxygen Therapy: Patient Spontanous Breathing and Patient connected to face mask oxygen  Post-op Assessment: Report given to RN and Post -op Vital signs reviewed and stable  Post vital signs: Reviewed and stable  Last Vitals:  Vitals Value Taken Time  BP 121/72 02/22/24 11:20  Temp    Pulse 55 02/22/24 11:25  Resp 15 02/22/24 11:25  SpO2 96 % 02/22/24 11:25  Vitals shown include unfiled device data.  Last Pain:  Vitals:   02/22/24 1037  TempSrc: Temporal  PainSc: 0-No pain         Complications: No notable events documented.

## 2024-02-22 NOTE — Anesthesia Postprocedure Evaluation (Signed)
 Anesthesia Post Note  Patient: Francisco Jones  Procedure(s) Performed: EGD (ESOPHAGOGASTRODUODENOSCOPY) ESOPHAGOSCOPY, WITH ESOPHAGEAL VARICES BAND LIGATION     Patient location during evaluation: Endoscopy Anesthesia Type: MAC Level of consciousness: awake and alert Pain management: pain level controlled Vital Signs Assessment: post-procedure vital signs reviewed and stable Respiratory status: spontaneous breathing, nonlabored ventilation and respiratory function stable Cardiovascular status: stable and blood pressure returned to baseline Postop Assessment: no apparent nausea or vomiting Anesthetic complications: no   No notable events documented.  Last Vitals:  Vitals:   02/22/24 1130 02/22/24 1140  BP: 120/73 116/73  Pulse: (!) 53 (!) 53  Resp: 14 16  Temp: 36.8 C   SpO2: 97% 97%    Last Pain:  Vitals:   02/22/24 1140  TempSrc:   PainSc: 0-No pain                 Zakkiyya Barno,W. EDMOND

## 2024-03-01 ENCOUNTER — Telehealth: Payer: Self-pay | Admitting: Physician Assistant

## 2024-03-14 NOTE — Progress Notes (Signed)
 [facility] [phone]   [phone]  INITIAL CONSULT NOTE  Patient Care Team: Hugh Charleston, MD (Inactive) as PCP - General (Family Medicine)  CHIEF COMPLAINTS/PURPOSE OF CONSULTATION:  Thrombocytopenia  HISTORY OF PRESENTING ILLNESS:  Francisco Jones 58 y.o. male with medical history significant for cirrhosis presents to the hematology clinic for evaluation for thrombocytopenia. He is unaccompanied for this visit.   On review of the previous records, [name] underwent abdominal Us  in June 2023 that was concerning for cirrhosis. Blood work from September 2024 indicated thrombocytopenia with platelet count of 36K. More recent labs from 11/08/2023 showed persistent thrombocytopenia with platelet count of 35K.   On exam today, [name] reports he has no evidence of easy bruising or overt bleeding including hematochezia or melena. He reports his energy and appetite are overall stable. He denies nausea, vomiting or bowel habit changes. He denies fevers, chills,sweats, shortness of breath, chest pain or cough. Rest of the ROS is below.   MEDICAL HISTORY:  Past Medical History:  Diagnosis Date   Cirrhosis (HCC)    Hypertension    Platelets decreased (HCC)    low platelets   Psoriasis     SURGICAL HISTORY: Past Surgical History:  Procedure Laterality Date   BIOPSY  02/09/2023   Procedure: BIOPSY;  Surgeon: Saintclair Jasper, MD;  Location: [facility];  Service: Gastroenterology;;   ESOPHAGEAL BANDING  02/09/2023   Procedure: ESOPHAGEAL BANDING;  Surgeon: Saintclair Jasper, MD;  Location: [facility];  Service: Gastroenterology;;   ESOPHAGEAL BANDING  05/04/2023   Procedure: ESOPHAGEAL BANDING;  Surgeon: Saintclair Jasper, MD;  Location: [facility];  Service: Gastroenterology;;   ESOPHAGEAL BANDING  02/22/2024   Procedure: ESOPHAGOSCOPY, WITH ESOPHAGEAL VARICES BAND LIGATION;  Surgeon: Saintclair Jasper, MD;  Location: [facility];  Service: Gastroenterology;;    ESOPHAGOGASTRODUODENOSCOPY N/A 02/22/2024   Procedure: EGD (ESOPHAGOGASTRODUODENOSCOPY);  Surgeon: Saintclair Jasper, MD;  Location: [facility];  Service: Gastroenterology;  Laterality: N/A;   ESOPHAGOGASTRODUODENOSCOPY (EGD) WITH PROPOFOL  N/A 02/09/2023   Procedure: ESOPHAGOGASTRODUODENOSCOPY (EGD) WITH PROPOFOL ;  Surgeon: Saintclair Jasper, MD;  Location: [facility];  Service: Gastroenterology;  Laterality: N/A;   ESOPHAGOGASTRODUODENOSCOPY (EGD) WITH PROPOFOL  N/A 05/04/2023   Procedure: ESOPHAGOGASTRODUODENOSCOPY (EGD) WITH PROPOFOL  with Banding;  Surgeon: Saintclair Jasper, MD;  Location: [facility];  Service: Gastroenterology;  Laterality: N/A;   KNEE SURGRY Left    VEIN LIGATION AND STRIPPING Right     SOCIAL HISTORY: Social History   Socioeconomic History   Marital status: Married    Spouse name: Hadassah   Number of children: 2   Years of education: Master's   Highest education level: Not on file  Occupational History   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 Came to the [place]  in [place].   Social Drivers of Corporate investment banker Strain: Not on file  Food Insecurity: Food Insecurity Present (03/15/2024)   Hunger Vital Sign    Worried About Running Out of Food in the Last Year: Sometimes true    Ran Out of Food in the Last Year: Never  true  Transportation Needs: No Transportation Needs (03/15/2024)   PRAPARE - Administrator, Civil Service (Medical): No    Lack of Transportation (Non-Medical): No  Physical Activity: Not on file  Stress: Not on file  Social Connections: Not on file  Intimate Partner  Violence: Not At Risk (03/15/2024)   Humiliation, Afraid, Rape, and Kick questionnaire    Fear of Current or Ex-Partner: No    Emotionally Abused: No    Physically Abused: No    Sexually Abused: No    FAMILY HISTORY: Family History  Problem Relation Age of Onset   Stroke Mother     ALLERGIES:  is allergic to penicillins.  MEDICATIONS:  Current Outpatient Medications  Medication Sig Dispense Refill   carvedilol (COREG) 6.25 MG tablet Take 3.125 mg by mouth 2 (two) times daily.     MILK THISTLE PO Take 1-2 capsules by mouth See admin instructions. Take 1 capsule by mouth in the morning & take 2 capsules by mouth in the afternoon.     SKYRIZI PEN 150 MG/ML pen      VITAMIN E PO Take 1 capsule by mouth in the morning.     augmented betamethasone dipropionate (DIPROLENE-AF) 0.05 % cream Apply 1 Application topically 2 (two) times daily as needed (skin irritation.). (Patient not taking: Reported on 03/15/2024)     BILE SALTS-CAPS-CASC-PHENOLPTH PO Take 550 mg by mouth in the morning and at bedtime. TUDCA (Tauroursodeoxycholic acid) (Patient not taking: Reported on 03/15/2024)     No current facility-administered medications for this visit.    REVIEW OF SYSTEMS:   Constitutional: ( - ) fevers, ( - )  chills , ( - ) night sweats Eyes: ( - ) blurriness of vision, ( - ) double vision, ( - ) watery eyes Ears, nose, mouth, throat, and face: ( - ) mucositis, ( - ) sore throat Respiratory: ( - ) cough, ( - ) dyspnea, ( - ) wheezes Cardiovascular: ( - ) palpitation, ( - ) chest discomfort, ( - ) lower extremity swelling Gastrointestinal:  ( - ) nausea, ( - ) heartburn, ( - ) change in bowel habits Skin: ( - ) abnormal skin rashes Lymphatics: ( - ) new lymphadenopathy, ( - ) easy bruising Neurological: ( - ) numbness, ( - ) tingling, ( - ) new weaknesses Behavioral/Psych: ( - ) mood change, ( - ) new changes  All other systems were reviewed with the patient and are negative.  PHYSICAL  EXAMINATION: ECOG PERFORMANCE STATUS: 0 - Asymptomatic  Vitals:   03/15/24 1023  BP: 114/76  Pulse: (!) 52  Resp: 18  Temp: (!) 97.4 F (36.3 C)  SpO2: 98%   Filed Weights   03/15/24 1023  Weight: 194 lb 6.4 oz (88.2 kg)    GENERAL: well appearing male in NAD  SKIN: skin color, texture, turgor are normal, no rashes or significant lesions EYES: conjunctiva are pink and non-injected, sclera clear LUNGS: clear to auscultation and percussion with normal breathing effort HEART: regular rate & rhythm and no murmurs and no lower extremity edema Musculoskeletal: no cyanosis of digits and no clubbing  PSYCH: alert & oriented x 3, fluent speech NEURO: no focal motor/sensory deficits  LABORATORY DATA:  I have reviewed the data as listed    Latest Ref Rng & Units 03/15/2024   11:06 AM  CBC  WBC 4.0 - 10.5 K/uL 2.6   Hemoglobin 13.0 - 17.0 g/dL 86.3   Hematocrit 60.9 - 52.0 %  37.7   Platelets 150 - 400 K/uL 32        Latest Ref Rng & Units 03/15/2024   11:06 AM  CMP  Glucose 70 - 99 mg/dL 92   BUN 6 - 20 mg/dL 14   Creatinine 9.38 - 1.24 mg/dL 9.30   Sodium 864 - 854 mmol/L 139   Potassium 3.5 - 5.1 mmol/L 3.9   Chloride 98 - 111 mmol/L 106   CO2 22 - 32 mmol/L 30   Calcium 8.9 - 10.3 mg/dL 8.7   Total Protein 6.5 - 8.1 g/dL 7.4   Total Bilirubin 0.0 - 1.2 mg/dL 2.6   Alkaline Phos 38 - 126 U/L 67   AST 15 - 41 U/L 31   ALT 0 - 44 U/L 24     RADIOGRAPHIC STUDIES: I have personally reviewed the radiological images as listed and agreed with the findings in the report. No results found.  ASSESSMENT & PLAN Dunbar Lemmerman is a 58 y.o.male who presents to the clinic for evaluation of thrombocytopenia.   I reviewed potential etiologies for thrombocytopenia including liver disease, splenomegaly, infectious processes, nutritional anemias, immune mediated and bone marrow disorders.    Patient has known cirrhosis with splenomegaly that is the likely underlying cause. We will rule  out other causes with serologic workup today.   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  --Evaluate for platelet abnormality with save smear. --Discussed role for TPO agonist if platelet count is less than 20K, patient develops bleeding risk or he is planning to undergo surgical procedure. --RTC based on above workup.    Orders Placed This Encounter  Procedures   CBC with Differential (Cancer Center Only)    Standing Status:   Future    Number of Occurrences:   1    Expiration Date:   03/15/2025   CMP (Cancer Center only)    Standing Status:   Future    Number of Occurrences:   1    Expiration Date:   03/15/2025   Immature Platelet Fraction    Standing Status:   Future    Number of Occurrences:   1    Expiration Date:   03/15/2025   WBC/PLT in Citrate   Technologist smear review    Clinical information::   thrombocytopenia   Multiple Myeloma Panel (SPEP&IFE w/QIG)    Standing Status:   Future    Number of Occurrences:   1    Expiration Date:   03/15/2025    All questions were answered. The patient knows to call the clinic with any problems, questions or concerns.  I have spent a total of 60 minutes minutes of face-to-face and non-face-to-face time, preparing to see the patient, obtaining and/or reviewing separately obtained history, performing a medically appropriate examination, counseling and educating the patient, ordering medications/tests/procedures, referring and communicating with other health care professionals, documenting clinical information in the electronic health record, independently interpreting results and communicating results to the patient, and care coordination.   Johnston Police, PA-C Department of Hematology/Oncology [facility] at [facility] Phone: [phone]  Patient was seen with Dr. Federico  I have read the above note and personally  examined the patient. I agree with the assessment and plan as noted above.  Briefly Mr. Gougeon is a 58 year old male with medical history significant for cirrhosis of unclear etiology who presents for evaluation of leukopenia and thrombocytopenia.  At this time  findings do appear most consistent with thrombocytopenia in the setting of cirrhosis.  The liver is responsible for the production of TPO and if the liver is not functioning properly platelet counts tend to drop.  Typically treatment is not required as long as the platelets are consistently above 20.  In the event the patient would develop bleeding or issues with his thrombocytopenia we could consider therapy with a medication such as Doptelet or Nplate.  Additionally the patient would require a procedure and a higher platelet count were required we could assist in bolstering his counts with 1 of these medications.  At this time he is not having any clinical signs or symptoms concerning for bleeding.  I would recommend routine follow-up with his gastroenterologist.  Additionally today we will perform further workup to assure no other etiology for thrombocytopenia.  We will order multiple myeloma panel, immature platelet fraction, blood smear, and repeat CBC.  The patient voiced understanding of our findings and plan moving forward.   Norleen IVAR Kidney, MD Department of Hematology/Oncology [facility] at [facility] Phone: [phone] Pager: [phone] Email: [email or web address]

## 2024-03-15 ENCOUNTER — Inpatient Hospital Stay

## 2024-03-15 ENCOUNTER — Inpatient Hospital Stay: Attending: Physician Assistant | Admitting: Physician Assistant

## 2024-03-15 VITALS — BP 114/76 | HR 52 | Temp 97.4°F | Resp 18 | Ht 68.0 in | Wt 194.4 lb

## 2024-03-15 DIAGNOSIS — D696 Thrombocytopenia, unspecified: Secondary | ICD-10-CM

## 2024-03-15 DIAGNOSIS — R161 Splenomegaly, not elsewhere classified: Secondary | ICD-10-CM | POA: Insufficient documentation

## 2024-03-15 DIAGNOSIS — K7460 Unspecified cirrhosis of liver: Secondary | ICD-10-CM | POA: Insufficient documentation

## 2024-03-15 DIAGNOSIS — D6959 Other secondary thrombocytopenia: Secondary | ICD-10-CM | POA: Diagnosis present

## 2024-03-15 LAB — CBC WITH DIFFERENTIAL (CANCER CENTER ONLY)
Abs Immature Granulocytes: 0 K/uL (ref 0.00–0.07)
Basophils Absolute: 0 K/uL (ref 0.0–0.1)
Basophils Relative: 1 %
Eosinophils Absolute: 0.1 K/uL (ref 0.0–0.5)
Eosinophils Relative: 2 %
HCT: 37.7 % — ABNORMAL LOW (ref 39.0–52.0)
Hemoglobin: 13.6 g/dL (ref 13.0–17.0)
Immature Granulocytes: 0 %
Lymphocytes Relative: 27 %
Lymphs Abs: 0.7 K/uL (ref 0.7–4.0)
MCH: 31.3 pg (ref 26.0–34.0)
MCHC: 36.1 g/dL — ABNORMAL HIGH (ref 30.0–36.0)
MCV: 86.9 fL (ref 80.0–100.0)
Monocytes Absolute: 0.2 K/uL (ref 0.1–1.0)
Monocytes Relative: 8 %
Neutro Abs: 1.6 K/uL — ABNORMAL LOW (ref 1.7–7.7)
Neutrophils Relative %: 62 %
Platelet Count: 32 K/uL — ABNORMAL LOW (ref 150–400)
RBC: 4.34 MIL/uL (ref 4.22–5.81)
RDW: 13.5 % (ref 11.5–15.5)
WBC Count: 2.6 K/uL — ABNORMAL LOW (ref 4.0–10.5)
nRBC: 0 % (ref 0.0–0.2)

## 2024-03-15 LAB — CMP (CANCER CENTER ONLY)
ALT: 24 U/L (ref 0–44)
AST: 31 U/L (ref 15–41)
Albumin: 3.9 g/dL (ref 3.5–5.0)
Alkaline Phosphatase: 67 U/L (ref 38–126)
Anion gap: 3 — ABNORMAL LOW (ref 5–15)
BUN: 14 mg/dL (ref 6–20)
CO2: 30 mmol/L (ref 22–32)
Calcium: 8.7 mg/dL — ABNORMAL LOW (ref 8.9–10.3)
Chloride: 106 mmol/L (ref 98–111)
Creatinine: 0.69 mg/dL (ref 0.61–1.24)
GFR, Estimated: >60 mL/min (ref >60)
Glucose, Bld: 92 mg/dL (ref 70–99)
Potassium: 3.9 mmol/L (ref 3.5–5.1)
Sodium: 139 mmol/L (ref 135–145)
Total Bilirubin: 2.6 mg/dL — ABNORMAL HIGH (ref 0.0–1.2)
Total Protein: 7.4 g/dL (ref 6.5–8.1)

## 2024-03-15 LAB — TECHNOLOGIST SMEAR REVIEW

## 2024-03-15 LAB — WBC/PLT IN CITRATE

## 2024-03-15 LAB — IMMATURE PLATELET FRACTION: Immature Platelet Fraction: 6.6 % (ref 1.2–8.6)

## 2024-03-17 ENCOUNTER — Encounter: Admitting: Physician Assistant

## 2024-03-17 ENCOUNTER — Other Ambulatory Visit

## 2024-03-20 ENCOUNTER — Telehealth: Payer: Self-pay | Admitting: *Deleted

## 2024-03-20 LAB — MULTIPLE MYELOMA PANEL, SERUM
Albumin SerPl Elph-Mcnc: 3.6 g/dL (ref 2.9–4.4)
Albumin/Glob SerPl: 1.2 (ref 0.7–1.7)
Alpha 1: 0.1 g/dL (ref 0.0–0.4)
Alpha2 Glob SerPl Elph-Mcnc: 0.5 g/dL (ref 0.4–1.0)
B-Globulin SerPl Elph-Mcnc: 0.9 g/dL (ref 0.7–1.3)
Gamma Glob SerPl Elph-Mcnc: 1.7 g/dL (ref 0.4–1.8)
Globulin, Total: 3.2 g/dL (ref 2.2–3.9)
IgA: 402 mg/dL — ABNORMAL HIGH (ref 90–386)
IgG (Immunoglobin G), Serum: 1666 mg/dL — ABNORMAL HIGH (ref 603–1613)
IgM (Immunoglobulin M), Srm: 124 mg/dL (ref 20–172)
Total Protein ELP: 6.8 g/dL (ref 6.0–8.5)

## 2024-03-20 NOTE — Telephone Encounter (Signed)
 Received vm message from pt inquiring about about his lab results from 03/15/24  Please advise.

## 2024-03-21 ENCOUNTER — Encounter: Payer: Self-pay | Admitting: Physician Assistant

## 2024-03-21 ENCOUNTER — Telehealth: Payer: Self-pay | Admitting: Physician Assistant

## 2024-03-21 NOTE — Telephone Encounter (Signed)
 I called and spoke to Mr. Francisco Jones to review the lab results from 03/15/2024.  CBC showed leukopenia and thrombocytopenia with WBC 2.6 and platelet count of 32K.  Remaining workup ruled out immune mediated process, pseudothrombocytopenia and paraproteinemia.  Likely cause of patient's thrombocytopenia is underlying cirrhosis and splenomegaly.  Advised to follow-up with [facility] every 6 months.  Patient will follow-up with us  if platelet count <20K to discuss TPO agonists. Mr. Francisco Jones expressed understanding and satisfaction with plan provided.

## 2024-05-15 ENCOUNTER — Other Ambulatory Visit: Payer: Self-pay

## 2024-05-15 DIAGNOSIS — I8393 Asymptomatic varicose veins of bilateral lower extremities: Secondary | ICD-10-CM

## 2024-06-05 ENCOUNTER — Encounter: Payer: Self-pay | Admitting: Nurse Practitioner

## 2024-06-05 ENCOUNTER — Other Ambulatory Visit: Payer: Self-pay | Admitting: Nurse Practitioner

## 2024-06-05 DIAGNOSIS — R140 Abdominal distension (gaseous): Secondary | ICD-10-CM

## 2024-06-05 DIAGNOSIS — R197 Diarrhea, unspecified: Secondary | ICD-10-CM

## 2024-06-05 DIAGNOSIS — R109 Unspecified abdominal pain: Secondary | ICD-10-CM

## 2024-06-07 ENCOUNTER — Inpatient Hospital Stay
Admission: RE | Admit: 2024-06-07 | Discharge: 2024-06-07 | Attending: Nurse Practitioner | Admitting: Nurse Practitioner

## 2024-06-07 DIAGNOSIS — R109 Unspecified abdominal pain: Secondary | ICD-10-CM

## 2024-06-07 DIAGNOSIS — R197 Diarrhea, unspecified: Secondary | ICD-10-CM

## 2024-06-07 DIAGNOSIS — R140 Abdominal distension (gaseous): Secondary | ICD-10-CM

## 2024-06-07 MED ORDER — IOPAMIDOL (ISOVUE-300) INJECTION 61%
100.0000 mL | Freq: Once | INTRAVENOUS | Status: AC | PRN
  Administered 2024-06-07: 100 mL via INTRAVENOUS

## 2024-06-12 ENCOUNTER — Other Ambulatory Visit

## 2024-06-22 ENCOUNTER — Ambulatory Visit (HOSPITAL_COMMUNITY): Attending: Vascular Surgery | Admitting: Vascular Surgery

## 2024-06-22 ENCOUNTER — Ambulatory Visit (HOSPITAL_BASED_OUTPATIENT_CLINIC_OR_DEPARTMENT_OTHER)

## 2024-06-22 ENCOUNTER — Encounter: Payer: Self-pay | Admitting: Vascular Surgery

## 2024-06-22 ENCOUNTER — Ambulatory Visit (HOSPITAL_COMMUNITY)

## 2024-06-22 DIAGNOSIS — I8393 Asymptomatic varicose veins of bilateral lower extremities: Secondary | ICD-10-CM | POA: Diagnosis not present

## 2024-06-22 NOTE — Progress Notes (Signed)
 Office Note     CC: Right lower extremity varicose veins Requesting Provider:  Auston Opal, DO  HPI: Francisco Jones is a 58 y.o. (1966/01/17) male who presents at the request of Francisco Charleston, MD (Inactive) for evaluation of right lower extremity varicose veins.  On exam, Francisco Jones was doing well.  In his [place], he immigrated to [place]  in the 90s as a refugee.  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  He has 2 children whom he is very proud of.  His daughter lives in [place], and his son is a holiday representative at [employer].  Patient has a history of right lower extremity varicose veins, and underwent saphenous vein stripping in his late 20s as well as stab phlebectomy.  He has recently lost weight, and noted worsening of some varicosities on the medial aspect of the right leg.  Denies pain, notes some itching in the thigh.  Denies bleeding, denies ulceration.  Wears compression intermittently.  No history of DVT   Past Medical History:  Diagnosis Date   Cirrhosis (HCC)    Hypertension    Platelets decreased    low platelets   Psoriasis     Past Surgical History:  Procedure Laterality Date   BIOPSY  02/09/2023   Procedure: BIOPSY;  Surgeon: Saintclair Jasper, MD;  Location: [facility];  Service: Gastroenterology;;   ESOPHAGEAL BANDING  02/09/2023   Procedure: ESOPHAGEAL BANDING;  Surgeon: Saintclair Jasper, MD;  Location: [facility];  Service: Gastroenterology;;   ESOPHAGEAL BANDING  05/04/2023   Procedure: ESOPHAGEAL BANDING;  Surgeon: Saintclair Jasper, MD;  Location: [facility];  Service: Gastroenterology;;   ESOPHAGEAL BANDING  02/22/2024   Procedure: ESOPHAGOSCOPY, WITH ESOPHAGEAL VARICES BAND LIGATION;  Surgeon: Saintclair Jasper, MD;  Location: [facility];  Service: Gastroenterology;;   ESOPHAGOGASTRODUODENOSCOPY N/A 02/22/2024   Procedure: EGD (ESOPHAGOGASTRODUODENOSCOPY);  Surgeon: Saintclair Jasper, MD;  Location: [facility];  Service: Gastroenterology;  Laterality: N/A;   ESOPHAGOGASTRODUODENOSCOPY (EGD) WITH PROPOFOL  N/A 02/09/2023   Procedure: ESOPHAGOGASTRODUODENOSCOPY (EGD) WITH PROPOFOL ;  Surgeon: Saintclair Jasper, MD;  Location: [facility];  Service: Gastroenterology;  Laterality: N/A;   ESOPHAGOGASTRODUODENOSCOPY (EGD) WITH PROPOFOL  N/A 05/04/2023   Procedure: ESOPHAGOGASTRODUODENOSCOPY (EGD) WITH PROPOFOL  with Banding;  Surgeon: Saintclair Jasper, MD;  Location: [facility];  Service: Gastroenterology;  Laterality: N/A;   KNEE SURGRY Left    VEIN LIGATION AND STRIPPING Right     Social History   Socioeconomic History   Marital status: Married    Spouse name: Hadassah   Number of children: 2   Years of education: Master's   Highest education level: Not on file  Occupational History   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 [place]ame [place]  in [place].   Social Drivers of Health   Tobacco Use: Low Risk (06/22/2024)   Patient History    Smoking Tobacco Use: Never    Smokeless Tobacco Use: Never    Passive Exposure: Never  Recent Concern: Tobacco Use - Medium Risk (05/09/2024)  Received from [facility]   Patient History    Smoking Tobacco Use: Former    Smokeless Tobacco Use: Never    Passive Exposure: Not on file  Financial Resource Strain: Not on file  Food Insecurity: Food Insecurity Present (03/15/2024)   Epic    Worried About Programme Researcher, Broadcasting/film/video in the Last Year: Sometimes true    Ran Out of Food in the Last Year: Never true  Transportation Needs: No Transportation Needs (03/15/2024)   Epic    Lack of  Transportation (Medical): No    Lack of Transportation (Non-Medical): No  Physical Activity: Not on file  Stress: Not on file  Social Connections: Not on file  Intimate Partner Violence: Not At Risk (03/15/2024)   Epic    Fear of Current or Ex-Partner: No    Emotionally Abused: No    Physically Abused: No    Sexually Abused: No  Depression (PHQ2-9): Low Risk (03/15/2024)   Depression (PHQ2-9)    PHQ-2 Score: 0  Alcohol Screen: Not on file  Housing: Unknown (05/09/2024)   Received from [facility]   Epic    Unable to Pay for Housing in the Last Year: Not on file    Number of Times Moved in the Last Year: Not on file    At any time in the past 12 months, were you homeless or living in a shelter (including now)?: No  Utilities: Not At Risk (03/15/2024)   Epic    Threatened with loss of utilities: No  Health Literacy: Not on file    Family History  Problem Relation Age of Onset   Stroke Mother     Current Outpatient Medications  Medication Sig Dispense Refill   carvedilol (COREG) 6.25 MG tablet Take 3.125 mg by mouth 2 (two) times daily.     MILK THISTLE PO Take 1-2 capsules by mouth See admin instructions. Take 1 capsule by mouth in the morning & take 2 capsules by mouth in the afternoon.     SKYRIZI PEN 150 MG/ML pen      VITAMIN E PO Take 1 capsule by mouth in the morning.     No current facility-administered medications for this visit.    Allergies[1]   REVIEW OF SYSTEMS:   [X]  denotes positive finding, [ ]  denotes negative finding Cardiac  Comments:  Chest pain or chest pressure:    Shortness of breath upon exertion:    Short of breath when lying flat:    Irregular heart rhythm:        Vascular    Pain in calf, thigh, or hip brought on by ambulation:    Pain in feet at night that wakes you up from your sleep:     Blood clot in your veins:    Leg swelling:         Pulmonary    Oxygen at home:    Productive cough:     Wheezing:          Neurologic    Sudden weakness in arms or legs:     Sudden numbness in arms or legs:     Sudden onset of difficulty speaking or slurred speech:    Temporary loss of vision in one eye:     Problems with dizziness:         Gastrointestinal    Blood in stool:     Vomited blood:         Genitourinary  Burning when urinating:     Blood in urine:        Psychiatric    Major depression:         Hematologic    Bleeding problems:    Problems with blood clotting too easily:        Skin    Rashes or ulcers:        Constitutional    Fever or chills:      PHYSICAL EXAMINATION:  Vitals:   06/22/24 1029  BP: 115/71  Pulse: (!) 57  Resp: 16  Temp: 98.2 F (36.8 C)  TempSrc: Temporal  SpO2: 99%  Weight: 195 lb 8 oz (88.7 kg)  Height: 5' 8 (1.727 m)    General:  WDWN in NAD; vital signs documented above Gait: Not observed HENT: WNL, normocephalic Pulmonary: normal non-labored breathing , without Rales, rhonchi,  wheezing Cardiac: regular HR Abdomen: soft, NT, no masses Skin: without rashes Vascular Exam/Pulses:  Right Left  Radial 2+ (normal) 2+ (normal)  Ulnar    Femoral    Popliteal    DP 2+ (normal) 2+ (normal)  PT     Extremities: without ischemic changes, without Gangrene , without cellulitis; without open wounds; varicose veins bilaterally.  No ulceration, no concerns for skin thinning Musculoskeletal: no muscle wasting or atrophy  Neurologic: A&O X 3;  No focal weakness or paresthesias are detected Psychiatric:  The pt has Normal affect.   Non-Invasive Vascular Imaging:   Greater saphenous vein stripped    ASSESSMENT/PLAN:: 58 y.o. male presenting with prior history of right greater saphenous vein stripping, stab phlebectomy.  Patient presents with worsening of some varicosities on the bilateral lower extremities.SABRA  He is relatively asymptomatic except for some itching which is intermittent.  Can also appreciate some tightness in the calf, which is not  very bothersome, and also intermittent.  Had a long conversation with him regarding the above.  I do not think that he requires any intervention at this time.  We discussed that should he have significant pain, erythema, edema, bleeding, ulceration, we would discuss stat phlebectomy.  At this time, I think he would be best served with medical management which includes compression, elevation, continued exercise.    Rachel was comfortable with this plan.  He can follow-up me as needed. I offered for his son to shadow if he would like to learn more about vascular surgery.  He was given the number to my office   Fonda FORBES Rim, MD [facility] [phone]     [1]  Allergies Allergen Reactions   Penicillins Other (See Comments)    Buzzing around my head and losing consciousness  Patient does not remember the reaction happened when he was young.
